# Patient Record
Sex: Female | Born: 1935
Health system: Southern US, Community
[De-identification: ages and names within clinical notes are randomized; demographics above are authoritative.]

## PROBLEM LIST (undated history)

## (undated) DIAGNOSIS — N2581 Secondary hyperparathyroidism of renal origin: Secondary | ICD-10-CM

## (undated) DIAGNOSIS — I441 Atrioventricular block, second degree: Secondary | ICD-10-CM

## (undated) DIAGNOSIS — R3915 Urgency of urination: Secondary | ICD-10-CM

## (undated) DIAGNOSIS — E039 Hypothyroidism, unspecified: Secondary | ICD-10-CM

## (undated) DIAGNOSIS — Z950 Presence of cardiac pacemaker: Secondary | ICD-10-CM

## (undated) DIAGNOSIS — W19XXXA Unspecified fall, initial encounter: Secondary | ICD-10-CM

## (undated) HISTORY — PX: CATARACT EXTRACTION: SUR2

## (undated) HISTORY — DX: Urgency of urination: R39.15

## (undated) HISTORY — DX: Hypothyroidism, unspecified: E03.9

## (undated) HISTORY — DX: Secondary hyperparathyroidism of renal origin: N25.81

## (undated) HISTORY — DX: Atrioventricular block, second degree: I44.1

## (undated) HISTORY — DX: Unspecified fall, initial encounter: W19.XXXA

## (undated) HISTORY — PX: APPENDECTOMY: SHX54

---

## 1995-04-12 HISTORY — PX: OTHER SURGICAL HISTORY: SHX169

## 2004-03-30 ENCOUNTER — Ambulatory Visit: Payer: Self-pay | Admitting: Internal Medicine

## 2005-03-11 ENCOUNTER — Ambulatory Visit: Payer: Self-pay | Admitting: Internal Medicine

## 2006-03-28 ENCOUNTER — Ambulatory Visit: Payer: Self-pay | Admitting: Internal Medicine

## 2006-03-28 LAB — CONVERTED CEMR LAB
Basophils Relative: 0.5 % (ref 0.0–1.0)
CO2: 27 meq/L (ref 19–32)
Calcium: 9.2 mg/dL (ref 8.4–10.5)
Chloride: 102 meq/L (ref 96–112)
Eosinophil percent: 2.4 % (ref 0.0–5.0)
Glucose, Bld: 89 mg/dL (ref 70–99)
Hemoglobin: 13.7 g/dL (ref 12.0–15.0)
Monocytes Absolute: 0.6 10*3/uL (ref 0.2–0.7)
Monocytes Relative: 7.5 % (ref 3.0–11.0)
Platelets: 239 10*3/uL (ref 150–400)
RDW: 12.4 % (ref 11.5–14.6)
Sodium: 138 meq/L (ref 135–145)
WBC: 7.9 10*3/uL (ref 4.5–10.5)

## 2006-07-05 ENCOUNTER — Ambulatory Visit: Payer: Self-pay | Admitting: Internal Medicine

## 2006-07-20 ENCOUNTER — Ambulatory Visit: Payer: Self-pay | Admitting: Internal Medicine

## 2006-07-21 ENCOUNTER — Encounter: Payer: Self-pay | Admitting: Internal Medicine

## 2006-10-12 ENCOUNTER — Ambulatory Visit: Payer: Self-pay | Admitting: Internal Medicine

## 2006-10-12 LAB — CONVERTED CEMR LAB: Vit D, 1,25-Dihydroxy: 55 (ref 20–57)

## 2006-10-17 ENCOUNTER — Ambulatory Visit: Payer: Self-pay | Admitting: Internal Medicine

## 2006-10-20 DIAGNOSIS — E039 Hypothyroidism, unspecified: Secondary | ICD-10-CM | POA: Insufficient documentation

## 2006-10-20 DIAGNOSIS — M810 Age-related osteoporosis without current pathological fracture: Secondary | ICD-10-CM | POA: Insufficient documentation

## 2007-01-08 ENCOUNTER — Telehealth: Payer: Self-pay | Admitting: Internal Medicine

## 2007-03-05 ENCOUNTER — Ambulatory Visit: Payer: Self-pay | Admitting: Internal Medicine

## 2007-04-17 ENCOUNTER — Ambulatory Visit: Payer: Self-pay | Admitting: Internal Medicine

## 2007-04-17 LAB — CONVERTED CEMR LAB: TSH: 3.45 microintl units/mL (ref 0.35–5.50)

## 2007-04-24 ENCOUNTER — Ambulatory Visit: Payer: Self-pay | Admitting: Internal Medicine

## 2007-04-24 DIAGNOSIS — R3915 Urgency of urination: Secondary | ICD-10-CM

## 2007-04-24 DIAGNOSIS — M159 Polyosteoarthritis, unspecified: Secondary | ICD-10-CM | POA: Insufficient documentation

## 2007-06-13 ENCOUNTER — Ambulatory Visit: Payer: Self-pay | Admitting: Internal Medicine

## 2007-06-13 DIAGNOSIS — H9390 Unspecified disorder of ear, unspecified ear: Secondary | ICD-10-CM | POA: Insufficient documentation

## 2007-06-18 LAB — CONVERTED CEMR LAB: Vit D, 1,25-Dihydroxy: 19 — ABNORMAL LOW (ref 30–89)

## 2007-09-20 ENCOUNTER — Ambulatory Visit: Payer: Self-pay | Admitting: Internal Medicine

## 2007-10-03 LAB — CONVERTED CEMR LAB: Vit D, 1,25-Dihydroxy: 39 (ref 30–89)

## 2007-11-14 ENCOUNTER — Ambulatory Visit: Payer: Self-pay | Admitting: Internal Medicine

## 2007-11-14 DIAGNOSIS — R42 Dizziness and giddiness: Secondary | ICD-10-CM

## 2007-11-14 DIAGNOSIS — R209 Unspecified disturbances of skin sensation: Secondary | ICD-10-CM | POA: Insufficient documentation

## 2007-11-20 ENCOUNTER — Telehealth: Payer: Self-pay | Admitting: Internal Medicine

## 2007-12-19 ENCOUNTER — Encounter: Payer: Self-pay | Admitting: Internal Medicine

## 2008-04-10 ENCOUNTER — Telehealth: Payer: Self-pay | Admitting: *Deleted

## 2008-05-07 ENCOUNTER — Telehealth: Payer: Self-pay | Admitting: Internal Medicine

## 2008-06-13 ENCOUNTER — Ambulatory Visit: Payer: Self-pay | Admitting: Internal Medicine

## 2008-08-26 ENCOUNTER — Telehealth: Payer: Self-pay | Admitting: Internal Medicine

## 2008-09-22 ENCOUNTER — Telehealth: Payer: Self-pay | Admitting: Family Medicine

## 2008-10-27 ENCOUNTER — Ambulatory Visit: Payer: Self-pay | Admitting: Internal Medicine

## 2008-10-30 ENCOUNTER — Ambulatory Visit: Payer: Self-pay | Admitting: Internal Medicine

## 2008-10-30 DIAGNOSIS — R634 Abnormal weight loss: Secondary | ICD-10-CM | POA: Insufficient documentation

## 2008-10-30 DIAGNOSIS — R404 Transient alteration of awareness: Secondary | ICD-10-CM

## 2008-11-05 DIAGNOSIS — E538 Deficiency of other specified B group vitamins: Secondary | ICD-10-CM | POA: Insufficient documentation

## 2008-12-04 ENCOUNTER — Telehealth: Payer: Self-pay | Admitting: *Deleted

## 2008-12-08 ENCOUNTER — Ambulatory Visit: Payer: Self-pay | Admitting: Internal Medicine

## 2008-12-16 ENCOUNTER — Ambulatory Visit: Payer: Self-pay | Admitting: Internal Medicine

## 2009-01-01 ENCOUNTER — Telehealth: Payer: Self-pay | Admitting: *Deleted

## 2009-01-27 ENCOUNTER — Ambulatory Visit: Payer: Self-pay | Admitting: Endocrinology

## 2009-01-27 DIAGNOSIS — E559 Vitamin D deficiency, unspecified: Secondary | ICD-10-CM | POA: Insufficient documentation

## 2009-01-27 DIAGNOSIS — N2581 Secondary hyperparathyroidism of renal origin: Secondary | ICD-10-CM | POA: Insufficient documentation

## 2009-04-13 ENCOUNTER — Ambulatory Visit: Payer: Self-pay | Admitting: Endocrinology

## 2009-04-23 ENCOUNTER — Telehealth: Payer: Self-pay | Admitting: Endocrinology

## 2009-07-21 ENCOUNTER — Telehealth: Payer: Self-pay | Admitting: Endocrinology

## 2009-12-15 ENCOUNTER — Telehealth: Payer: Self-pay | Admitting: Endocrinology

## 2010-01-12 ENCOUNTER — Encounter: Payer: Self-pay | Admitting: Internal Medicine

## 2010-01-29 ENCOUNTER — Telehealth: Payer: Self-pay | Admitting: Endocrinology

## 2010-02-03 ENCOUNTER — Ambulatory Visit: Payer: Self-pay | Admitting: Endocrinology

## 2010-02-04 LAB — CONVERTED CEMR LAB: TSH: 0.69 microintl units/mL (ref 0.35–5.50)

## 2010-03-02 ENCOUNTER — Encounter: Payer: Self-pay | Admitting: Internal Medicine

## 2010-03-02 ENCOUNTER — Ambulatory Visit: Payer: Self-pay | Admitting: Internal Medicine

## 2010-05-09 LAB — CONVERTED CEMR LAB
Albumin: 3.5 g/dL (ref 3.5–5.2)
Basophils Absolute: 0 10*3/uL (ref 0.0–0.1)
Basophils Relative: 0.2 % (ref 0.0–3.0)
Calcium, Total (PTH): 8.8 mg/dL (ref 8.4–10.5)
Cholesterol: 177 mg/dL (ref 0–200)
Folate: >20 ng/mL
GFR calc non Af Amer: 71.53 mL/min (ref >60)
Glucose, Bld: 99 mg/dL (ref 70–99)
HDL: 58.3 mg/dL (ref >39.00)
Hemoglobin: 13.1 g/dL (ref 12.0–15.0)
Lymphocytes Relative: 29.7 % (ref 12.0–46.0)
Monocytes Relative: 6 % (ref 3.0–12.0)
Neutro Abs: 4.6 10*3/uL (ref 1.4–7.7)
PTH: 75.9 pg/mL — ABNORMAL HIGH (ref 14.0–72.0)
PTH: 83.5 pg/mL — ABNORMAL HIGH (ref 14.0–72.0)
Potassium: 3.7 meq/L (ref 3.5–5.1)
RBC: 4.07 M/uL (ref 3.87–5.11)
RDW: 12.5 % (ref 11.5–14.6)
Sodium: 142 meq/L (ref 135–145)
Total Protein: 6.3 g/dL (ref 6.0–8.3)
Triglycerides: 73 mg/dL (ref 0.0–149.0)
Vit D, 25-Hydroxy: 23 ng/mL — ABNORMAL LOW (ref 30–89)
Vitamin B-12: 114 pg/mL — ABNORMAL LOW (ref 211–911)
Vitamin B-12: 304 pg/mL (ref 211–911)
WBC: 7.2 10*3/uL (ref 4.5–10.5)

## 2010-05-11 NOTE — Assessment & Plan Note (Signed)
Summary: 2 MTH FU  STC  RS'D PER PT/NWS   Vital Signs:  Patient profile:   77 year old female Menstrual status:  postmenopausal Height:      66 inches (167.64 cm) Weight:      117.13 pounds (53.24 kg) O2 Sat:      98 % on Room air Temp:     97.3 degrees F (36.28 degrees C) oral Pulse rate:   78 / minute BP sitting:   116 / 64  (left arm) Cuff size:   regular  Vitals Entered By: Josph Macho CMA (April 13, 2009 10:06 AM)  O2 Flow:  Room air CC: 2 month follow up/ Cf Is Patient Diabetic? No   Referring Provider:  Dr. Fabian Sharp Primary Provider:  Dr. Fabian Sharp  CC:  2 month follow up/ Cf.  History of Present Illness: pt brings her rocaltrol as rx'ed.  she brings her med bottles today.  she feels no different on the rocaltrol, and well in general.  she denies falls.  Current Medications (verified): 1)  Levoxyl 75 Mcg  Tabs (Levothyroxine Sodium) .Marland Kitchen.. 1 By Mouth Once Daily 2)  Vesicare 10 Mg  Tabs (Solifenacin Succinate) .... Take  1/2 By Mouth Once Daily For Bladder 3)  Vitamin B-12 1000 Mcg Tabs (Cyanocobalamin) .Marland Kitchen.. 1 Daily 4)  Calcium 500 Mg Tabs (Calcium Carbonate) .Marland Kitchen.. 1 Daily 5)  Calcitriol 0.25 Mcg Caps (Calcitriol) .Marland Kitchen.. 1 Qd  Allergies (verified): No Known Drug Allergies  Past History:  Past Medical History: Hypothyroidism Osteoporosis Urinary urgency ?incontinence  Hearing deficit    Consults None  Review of Systems       denies cramps.  Physical Exam  General:  elderly, frail, no distress  Chest Wall:  no kyphosis Msk:  muscle bulk and strength are grossly normal for age.  no obvious joint swelling.  gait is normal and steady  Psych:  Alert and cooperative; normal mood and affect; normal attention span and concentration.   Additional Exam:  Parathyroid Hormone       59.9 pg/mL                  14.0-72.0 Calcium                   9.3 mg/dL    Impression & Recommendations:  Problem # 1:  SECONDARY HYPERPARATHYROIDISM  (ICD-588.81) well-controlled  Other Orders: T-Parathyroid Hormone, Intact w/ Calcium ([address]) Est. Patient Level III ([address])  Patient Instructions: 1)  pending today's test results,  2)  continue calcitriol 0.25 micrograms/day 3)  return 6 months 4)  tests are being ordered for you today.  a few days after the test(s), please call [phone] to hear your test results.  Preventive Care Screening  Last Flu Shot:    Date:  02/09/2009    Results:  historical

## 2010-05-11 NOTE — Progress Notes (Signed)
Summary: [facility]/Dr. Everardo All  Phone Note Call from Patient Call back at Home Phone [phone]   Caller: Patient Summary of Call: pt called stating that Vesicare is too expensive. pt is requesting alt medication or would like to know if she can stop taking all together? please advise Initial call taken by: Margaret Pyle, CMA,  April 23, 2009 9:40 AM  Follow-up for Phone Call        I would not be in postion to say yes or no on the medication; I can change to oxybutinin generic if she wants. - would she want this? Follow-up by: Corwin Levins MD,  April 23, 2009 12:50 PM  Additional Follow-up for Phone Call Additional follow up Details #1::        left message on machine for pt to return my call  Additional Follow-up by: Margaret Pyle, CMA,  April 23, 2009 3:40 PM    Additional Follow-up for Phone Call Additional follow up Details #2::    Spoke with pt and wants to wait until Dr Everardo All gets back to make a change. Follow-up by: Josph Macho CMA,  April 24, 2009 10:23 AM   Appended Document: [facility] med/Dr. Everardo All change to oxybutinin 2.5 mg two times a day i sent rx Vanna Sailer, md  pt ststed that "her kidney are fine" and she does not want the medication. I advised that it was sent to the pharmacy and she said she"ll get it if she needs it. Margaret Pyle, CMA  May 04, 2009 1:25 PM   Clinical Lists Changes  Medications: Removed medication of VESICARE 10 MG  TABS (SOLIFENACIN SUCCINATE) take  1/2 by mouth once daily for bladder - Signed Added new medication of OXYBUTYNIN CHLORIDE 5 MG TABS (OXYBUTYNIN CHLORIDE) 1/2 tab bid - Signed Rx of OXYBUTYNIN CHLORIDE 5 MG TABS (OXYBUTYNIN CHLORIDE) 1/2 tab bid;  #30 x 11;  Signed;  Entered by: Minus Breeding MD;  Authorized by: Minus Breeding MD;  Method used: Electronically to [facility] [address] [place], [place], [place]  [address], Ph: [phone], Fax:  [phone]  noted, thank you Deshondra Worst, md  Prescriptions: OXYBUTYNIN CHLORIDE 5 MG TABS (OXYBUTYNIN CHLORIDE) 1/2 tab bid  #30 x 11   Entered and Authorized by:   Minus Breeding MD   Signed by:   Minus Breeding MD on 05/04/2009   Method used:   Electronically to        [facility] [address] (retail)       [address]       [place], [place]  [address]       Ph: [phone]       Fax: [phone]   RxID:   [id number]

## 2010-05-11 NOTE — Progress Notes (Signed)
Summary: Rx refill req  Phone Note Refill Request Message from:  Pharmacy on December 15, 2009 1:13 PM  Refills Requested: Medication #1:  LEVOXYL 75 MCG  TABS 1 by mouth once daily   Dosage confirmed as above?Dosage Confirmed   Supply Requested: 3 months  Method Requested: Electronic Initial call taken by: Margaret Pyle, CMA,  December 15, 2009 1:13 PM  Follow-up for Phone Call        Pt and pharmacy advised that pt needs OV, no further refills Follow-up by: Margaret Pyle, CMA,  December 15, 2009 1:14 PM    Prescriptions: LEVOXYL 75 MCG  TABS (LEVOTHYROXINE SODIUM) 1 by mouth once daily  #30 x 1   Entered and Authorized by:   Margaret Pyle, CMA   Signed by:   Margaret Pyle, CMA on 12/15/2009   Method used:   Telephoned to ...       [facility] [address] (retail)       [address]       [place], [place]  [address]       Ph: [phone]       Fax: [phone]   RxID:   [id number]

## 2010-05-11 NOTE — Progress Notes (Signed)
Summary: Calcitrol ?  Phone Note From Pharmacy   Caller: [facility] [address] Summary of Call: Pharmacy called. Pt has not been on calcitrol for several months. She is confused as to whether or not she should be on the medication. Please advise.  Initial call taken by: Lamar Sprinkles, CMA,  January 29, 2010 2:43 PM  Follow-up for Phone Call        as of last ov, answer is yes, you should be on it, but you are due for f/u Follow-up by: Minus Breeding MD,  January 29, 2010 2:46 PM  Additional Follow-up for Phone Call Additional follow up Details #1::        Pharm informed Additional Follow-up by: Lamar Sprinkles, CMA,  January 29, 2010 4:42 PM

## 2010-05-11 NOTE — Assessment & Plan Note (Signed)
Summary: per flag f/u #//cd   Vital Signs:  Patient profile:   77 year old female Menstrual status:  postmenopausal Height:      66 inches (167.64 cm) Weight:      114.50 pounds (52.05 kg) BMI:     18.55 O2 Sat:      97 % on Room air Temp:     98.1 degrees F (36.72 degrees C) oral Pulse rate:   86 / minute BP sitting:   110 / 70  (left arm) Cuff size:   regular  Vitals Entered By: Brenton Grills MA (February 03, 2010 9:53 AM)  O2 Flow:  Room air CC: Follow-up visit/aj Is Patient Diabetic? No   Referring Provider:  Dr. Fabian Sharp Primary Provider:  Dr. Fabian Sharp  CC:  Follow-up visit/aj.  History of Present Illness: pt has h/o secondary hyperparathyroidism, of uncertain etiology.  she is uncertain how long ago she ran out of rocaltrol.  no recent falls or bony fx.  Current Medications (verified): 1)  Levoxyl 75 Mcg  Tabs (Levothyroxine Sodium) .Marland Kitchen.. 1 By Mouth Once Daily 2)  Vitamin B-12 1000 Mcg Tabs (Cyanocobalamin) .Marland Kitchen.. 1 Daily 3)  Calcium 500 Mg Tabs (Calcium Carbonate) .Marland Kitchen.. 1 Daily 4)  Calcitriol 0.25 Mcg Caps (Calcitriol) .Marland Kitchen.. 1 Qd 5)  Oxybutynin Chloride 5 Mg Tabs (Oxybutynin Chloride) .... 1/2 Tab Bid  Allergies (verified): No Known Drug Allergies  Past History:  Past Medical History: Last updated: 04/13/2009 Hypothyroidism Osteoporosis Urinary urgency ?incontinence  Hearing deficit    Consults None  Review of Systems  The patient denies syncope.    Physical Exam  General:  elderly, frail, no distress  Neck:  Supple without thyroid enlargement or tenderness.  Msk:  gait is normal and steady no deformity Additional Exam:  Parathyroid Hormone       65.6 pg/mL                  14.0-72.0 Calcium                   9.2 mg/dL   FastTSH                   0.69 uIU/mL      Impression & Recommendations:  Problem # 1:  SECONDARY HYPERPARATHYROIDISM (ICD-588.81) well-controlled  Problem # 2:  HYPOTHYROIDISM (ICD-244.9) well-replaced  Other  Orders: T-Parathyroid Hormone, Intact w/ Calcium ([address]) TLB-TSH (Thyroid Stimulating Hormone) (84443-TSH) Est. Patient Level III ([address])  Patient Instructions: 1)  blood tests are being ordered for you today.  please call [phone] to hear your test results. 2)  pending today's test results, you should resume calcitriol 0.25 micrograms/day.  i have sent a refill to your pharmacy 3)  you are also due for a checkup with dr Fabian Sharp.  please make an appointment. 4)  Please schedule a follow-up appointment in 1 year. 5)  (update: i left message on phone-tree:  rx as we discussed) Prescriptions: CALCITRIOL 0.25 MCG CAPS (CALCITRIOL) 1 qd  #30 x 11   Entered and Authorized by:   Minus Breeding MD   Signed by:   Minus Breeding MD on 02/03/2010   Method used:   Electronically to        [facility] [address] (retail)       [address]       [place], [place]  [address]       Ph: [phone]  Fax: [phone]   RxID:   [id number]    Orders Added: 1)  T-Parathyroid Hormone, Intact w/ Calcium [[address]] 2)  TLB-TSH (Thyroid Stimulating Hormone) [84443-TSH] 3)  Est. Patient Level III [[address]]

## 2010-05-11 NOTE — Progress Notes (Signed)
Summary: weight loss  Phone Note Call from Patient Call back at Home Phone [phone]   Caller: Patient Summary of Call: pt called stating that she has lost 10lb and has been trying to gain it back with no success. Pt is requesting MD's advise as to what she can use to help, please advise. Initial call taken by: Margaret Pyle, CMA,  July 21, 2009 10:53 AM  Follow-up for Phone Call        please ask dr Fabian Sharp about this, as i only take care of the calcium mpart. Follow-up by: Minus Breeding MD,  July 21, 2009 1:02 PM  Additional Follow-up for Phone Call Additional follow up Details #1::        pt informed via VM Additional Follow-up by: Margaret Pyle, CMA,  July 21, 2009 2:02 PM

## 2010-05-11 NOTE — Progress Notes (Signed)
Summary: calcitriol  Phone Note Refill Request Message from:  Fax from Pharmacy on January 29, 2010 9:14 AM  Refills Requested: Medication #1:  CALCITRIOL 0.25 MCG CAPS 1 qd   Dosage confirmed as above?Dosage Confirmed   Last Refilled: 06/05/2009  Method Requested: Electronic Initial call taken by: Brenton Grills MA,  January 29, 2010 9:15 AM    Prescriptions: CALCITRIOL 0.25 MCG CAPS (CALCITRIOL) 1 qd  #30 x 0   Entered by:   Brenton Grills MA   Authorized by:   Minus Breeding MD   Signed by:   Brenton Grills MA on 01/29/2010   Method used:   Electronically to        [facility] [address] (retail)       [address]       [place], [place]  [address]       Ph: [phone]       Fax: [phone]   RxID:   [id number]

## 2010-05-11 NOTE — Miscellaneous (Signed)
Summary: Flu Shot/[facility]  Flu Shot/[facility]   Imported By: Maryln Gottron 01/18/2010 11:01:44  _____________________________________________________________________  External Attachment:    Type:   Image     Comment:   External Document

## 2010-05-11 NOTE — Assessment & Plan Note (Signed)
Summary: FU PER PT/NJR   Vital Signs:  Patient profile:   77 year old female Menstrual status:  postmenopausal Height:      65 inches Weight:      116 pounds Pulse rate:   66 / minute BP sitting:   110 / 70  (left arm) Cuff size:   regular  Vitals Entered By: Romualdo Bolk, CMA (AAMA) (March 02, 2010 10:18 AM) CC: Annual Visit for Disease Management- Pt wants to discuss gaining weight.- Pt is not fasting for labs.   History of Present Illness: Vanessa Keller    Here for Medicare AWV:  She has concerns about losing weight but feels pretty well. She is followed by Dr Meryl Crutch or her thyroid disease.   was told to follow up with PCP also. She is still hard of heaing and forgot her hearing aid today.  No faling or change in vision  1.   Risk factors based on Past M, S, F history: 2.   Physical Activities: independent and no falling .  still fardens some  plans to move to apartment condo type community 3.   Depression/mood: Good 4.   Hearing:  decrease 5.   ADL's:  all  plays bridge still  6.   Fall Risk:  no falling or dizziness 7.   Home Safety:  reviewed 8.   Height, weight, &visual acuity: reviewed   eye appt  9.   Counseling:  10.   Labs ordered based on risk factors:  11.           Referral Coordination 12.           Care Plan 13.            Cognitive Assessment  Pt is A&Ox3,affect,speech,memory,attention,&motor skills appear intact.   although says memory off some.  no disoeientation.    eats 3 x per day   /?losing weight.   ? gingko biloba. rec by friend and shows bottle today  moving   [place] .    Preventive Care Screening  Mammogram:    Date:  04/11/2008    Results:  normal   Last Tetanus Booster:    Date:  04/11/2004    Results:  Historical    Preventive Screening-Counseling & Management  Alcohol-Tobacco     Alcohol drinks/day: 0     Smoking Status: never  Caffeine-Diet-Exercise     Caffeine use/day: 1     Does Patient Exercise: yes  Type of exercise: Yard work     [facility] Depression Score: no  Hep-HIV-STD-Contraception     Dental Visit-last 6 months yes     Sun Exposure-Excessive: no  Safety-Violence-Falls     Seat Belt Use: yes     Smoke Detectors: yes     Fall Risk: no falls good balance works in [employer]  Current Medications (verified): 1)  Levoxyl 75 Mcg  Tabs (Levothyroxine Sodium) .Marland Kitchen.. 1 By Mouth Once Daily 2)  Vitamin B-12 1000 Mcg Tabs (Cyanocobalamin) .Marland Kitchen.. 1 Daily 3)  Calcium 500 Mg Tabs (Calcium Carbonate) .Marland Kitchen.. 1 Daily 4)  Calcitriol 0.25 Mcg Caps (Calcitriol) .Marland Kitchen.. 1 Qd 5)  Oxybutynin Chloride 5 Mg Tabs (Oxybutynin Chloride) .... 1/2 Tab Bid  Allergies (verified): No Known Drug Allergies  Past History:  Past medical, surgical, family and social histories (including risk factors) reviewed, and no changes noted (except as noted below).  Past Medical History: Reviewed history from 04/13/2009 and no changes required. Hypothyroidism Osteoporosis Urinary urgency ?incontinence  Hearing deficit  Consults None  Past Surgical History: Reviewed history from 10/20/2006 and no changes required. Left Carotid Tumor sx-nonmalignant 1997  Past History:  Care Management: Endocrinology: Everardo All  Family History: Reviewed history from 11/14/2007 and no changes required. Family History Other cancer Family History Thyroid disease  mom lived to 100 no cad   Social History: Reviewed history from 10/30/2008 and no changes required. widowed child died age 58 yrs Never Smoked   plays bridge without problem   3 meals a day prepared for her.   moving to  [place] soon   . Fall Risk:  no falls good balance works in [employer] w/in 6 mos.:  yes Sun Exposure-Excessive:  no Risk analyst Use:  yes  Review of Systems  The patient denies anorexia, fever, weight gain, vision loss, chest pain, syncope, dyspnea on exertion, peripheral edema, prolonged cough, abdominal pain, melena, hematochezia, severe  indigestion/heartburn, hematuria, muscle weakness, transient blindness, difficulty walking, depression, unusual weight change, abnormal bleeding, enlarged lymph nodes, angioedema, and breast masses.    Physical Exam  General:  elderly slender but not frail in nad  but hard of hearing.  gait nl interaction normal and has snse of humor Head:  Normocephalic and atraumatic without obvious abnormalities. No apparent alopecia or balding. Eyes:  vision grossly intact, pupils equal, and pupils round.   Ears:  hard of hearing no change  Nose:  no external deformity, no external erythema, and no nasal discharge.   Mouth:  pharynx pink and moist.   Neck:  No deformities, masses, or tenderness noted. Chest Wall:  No deformities, masses, or tenderness noted. Breasts:  No mass, nodules, thickening, tenderness, bulging, retraction, inflamation, nipple discharge or skin changes noted.   Lungs:  Normal respiratory effort, chest expands symmetrically. Lungs are clear to auscultation, no crackles or wheezes. Heart:  Normal rate and regular rhythm. S1 and S2 normal without gallop, murmur, click, rub or other extra sounds.no lifts.   Abdomen:  Bowel sounds positive,abdomen soft and non-tender without masses, organomegaly or hernias noted. Msk:  no joint warmth and no redness over joints.  some oa changes Pulses:  pulses intact without delay   Extremities:  no clubbing cyanosis or edema  Neurologic:  alert & oriented X3, strength normal in all extremities, and gait normal.  no tremor  Pt is A&Ox3,affect,speech,memory,attention,&motor skills appear intact.  Skin:  turgor normal, no suspicious lesions, no ecchymoses, and no petechiae.   Cervical Nodes:  No lymphadenopathy noted Axillary Nodes:  No palpable lymphadenopathy Inguinal Nodes:  No significant adenopathy Psych:  Oriented X3, normally interactive, good eye contact, not anxious appearing, and not depressed appearing.   EKG sinus rhythym LAD  qrs .108 [employer] qs  in anterior leads.  No prev to compare  Impression & Recommendations:  Problem # 1:  Preventive Health Care (ICD-V70.0) disc injury prevention diet  nutritionand activity    Problem # 2:  WEIGHT LOSS (ICD-783.21)  mild but present ove r the last year . actually up 2 # since last visit  she states sh feels well otherrwise  although wonders about her memory  .  plays bridge and  no problems with this.   orented x 3  today .   Orders: EKG w/ Interpretation (93000)  Problem # 3:  HEARING DEFICIT (ICD-389.9) forgot her herin aids today and difficult to asses   Problem # 4:  URINARY URGENCY (UJW-119.14) no change   Problem # 5:  VITAMIN D DEFICIENCY (ICD-268.9) per dr Everardo All  Problem # 6:  SECONDARY HYPERPARATHYROIDISM (ICD-588.81)  Complete Medication List: 1)  Levoxyl 75 Mcg Tabs (Levothyroxine sodium) .Marland Kitchen.. 1 by mouth once daily 2)  Vitamin B-12 1000 Mcg Tabs (Cyanocobalamin) .Marland Kitchen.. 1 daily 3)  Calcium 500 Mg Tabs (Calcium carbonate) .Marland Kitchen.. 1 daily 4)  Calcitriol 0.25 Mcg Caps (Calcitriol) .Marland Kitchen.. 1 qd 5)  Oxybutynin Chloride 5 Mg Tabs (Oxybutynin chloride) .... 1/2 tab bid  Patient Instructions: 1)  Increase   calories  to gain weight. 2)  Nuts  , peanut butter , cheeses,  other . 3)  ROV in 4 months  4)  we will weigh you at that time and if still losing weight then we may dosome more blood test and chest x ray  or other evaluation as  appropriate.  5)  If you wish we can also do a dietary consult  to go over your diet and  get advice about   how to  advance  increase calories in your diet . 6)  Continue  medications  including the  b12 , calcium and calcitrol.    Orders Added: 1)  Est. Patient 65& > [99397] 2)  Est. Patient Level III [32[address]] 3)  EKG w/ Interpretation [93000]     Eye Exam  02/09/2009- unsure of name. Normal. Romualdo Bolk, CMA (AAMA)  March 02, 2010 10:24 AM

## 2010-07-16 ENCOUNTER — Other Ambulatory Visit: Payer: Self-pay | Admitting: Endocrinology

## 2010-07-20 ENCOUNTER — Telehealth: Payer: Self-pay | Admitting: Internal Medicine

## 2010-07-20 NOTE — Telephone Encounter (Signed)
Pt needing to get a fasting cpx before end of June. Pt is moving to retirement home. Pls advise.

## 2010-07-21 NOTE — Telephone Encounter (Signed)
appt made

## 2010-07-21 NOTE — Telephone Encounter (Signed)
Per Dr. Fabian Sharp- can do 5/16 at 9:15am for cpx.

## 2010-08-13 ENCOUNTER — Encounter: Payer: Self-pay | Admitting: Internal Medicine

## 2010-08-16 ENCOUNTER — Encounter: Payer: Self-pay | Admitting: Internal Medicine

## 2010-08-16 ENCOUNTER — Ambulatory Visit (INDEPENDENT_AMBULATORY_CARE_PROVIDER_SITE_OTHER): Payer: Medicare Other | Admitting: Internal Medicine

## 2010-08-16 ENCOUNTER — Telehealth: Payer: Self-pay | Admitting: *Deleted

## 2010-08-16 VITALS — BP 100/60 | HR 78 | Ht 65.0 in | Wt 117.0 lb

## 2010-08-16 DIAGNOSIS — Z23 Encounter for immunization: Secondary | ICD-10-CM

## 2010-08-16 DIAGNOSIS — W19XXXA Unspecified fall, initial encounter: Secondary | ICD-10-CM

## 2010-08-16 DIAGNOSIS — E538 Deficiency of other specified B group vitamins: Secondary | ICD-10-CM

## 2010-08-16 DIAGNOSIS — Z0000 Encounter for general adult medical examination without abnormal findings: Secondary | ICD-10-CM

## 2010-08-16 DIAGNOSIS — M810 Age-related osteoporosis without current pathological fracture: Secondary | ICD-10-CM

## 2010-08-16 DIAGNOSIS — Z9181 History of falling: Secondary | ICD-10-CM

## 2010-08-16 DIAGNOSIS — S80219A Abrasion, unspecified knee, initial encounter: Secondary | ICD-10-CM

## 2010-08-16 DIAGNOSIS — H9190 Unspecified hearing loss, unspecified ear: Secondary | ICD-10-CM

## 2010-08-16 DIAGNOSIS — Z13220 Encounter for screening for lipoid disorders: Secondary | ICD-10-CM

## 2010-08-16 DIAGNOSIS — R3915 Urgency of urination: Secondary | ICD-10-CM

## 2010-08-16 DIAGNOSIS — N2581 Secondary hyperparathyroidism of renal origin: Secondary | ICD-10-CM

## 2010-08-16 DIAGNOSIS — E039 Hypothyroidism, unspecified: Secondary | ICD-10-CM

## 2010-08-16 LAB — CBC WITH DIFFERENTIAL/PLATELET
Eosinophils Relative: 1.2 % (ref 0.0–5.0)
Monocytes Absolute: 0.4 10*3/uL (ref 0.1–1.0)
Monocytes Relative: 6.2 % (ref 3.0–12.0)
Neutrophils Relative %: 63.6 % (ref 43.0–77.0)
Platelets: 187 10*3/uL (ref 150.0–400.0)
WBC: 7.1 10*3/uL (ref 4.5–10.5)

## 2010-08-16 LAB — TSH: TSH: 0.23 u[IU]/mL — ABNORMAL LOW (ref 0.35–5.50)

## 2010-08-16 LAB — BASIC METABOLIC PANEL
BUN: 15 mg/dL (ref 6–23)
Calcium: 8.7 mg/dL (ref 8.4–10.5)
Creatinine, Ser: 0.7 mg/dL (ref 0.4–1.2)
GFR: 85.94 mL/min (ref >60.00)

## 2010-08-16 LAB — VITAMIN B12: Vitamin B-12: 189 pg/mL — ABNORMAL LOW (ref 211–911)

## 2010-08-16 LAB — LIPID PANEL
LDL Cholesterol: 108 mg/dL — ABNORMAL HIGH (ref 0–99)
Total CHOL/HDL Ratio: 3
Triglycerides: 36 mg/dL (ref 0.0–149.0)

## 2010-08-16 NOTE — Telephone Encounter (Signed)
Pt fell in the lobby going to the lab. She states that she feels fine except her knees hurt a little from the carpet. I looked at Pt's knees and they were red but not swollen. Pt states that she can walk. Md to see pt.

## 2010-08-16 NOTE — Patient Instructions (Signed)
Will notify you  of labs when available. ROV in 6 months or depending on labs .  Consider getting advance directives  Defined.

## 2010-08-16 NOTE — Progress Notes (Signed)
Subjective:    Vanessa Keller is a 77 y.o. female who presents for a Medicare  Wellness and exam.   Cardiac risk factors: advanced age (older than 19 for men, 59 for women).  Activities of Daily Living  In your present state of health, do you have any difficulty performing the following activities?:  Preparing food and eating?: No Bathing yourself: No Getting dressed: No Using the toilet:No Moving around from place to place: No In the past year have you fallen or had a near fall?:No  Current exercise habits: Home exercise routine includes "whatever she needs to be doing".   Dietary issues discussed: eating well.  Prepares most of her own food   Depression Screen (Note: if answer to either of the following is "Yes", then a more complete depression screening is indicated)  Q1: Over the past two weeks, have you felt down, depressed or hopeless?no Q2: Over the past two weeks, have you felt little interest or pleasure in doing things? no   Hearing:  Hearing aids.  Vision:  Stable   Needs check  Follow up   Safety: No falls .  Has smoke detector and wears seat belts.  No firearms. No excess sun exposure. Sees dentist regularly   Advance directive :  Reviewed  Issue about this doesn't have foramal one. Memory: Not as good bu thinks its ok.   The following portions of the patient's history were reviewed and updated as appropriate: allergies, current medications, past family history, past medical history, past social history, past surgical history and problem list. Review of Systems Pertinent items are noted in HPI.  No cp sob syncope.     Objective:     Vision done by Dr. Nile Riggs on 04/12/2007 Blood pressure 100/60, pulse 78, height 5\' 5"  (1.651 m), weight 117 lb (53.071 kg). Body mass index is 19.47 kg/(m^2). Physical Exam: Vital signs reviewed YNW:GNFA is a well-developed well-nourished alert cooperative  white female who appears her stated age in no acute distress.  Hard of  hearing cognitively intact HEENT: normocephalic  traumatic , Eyes: PERRL EOM's full, conjunctiva clear, Nares: paten,t no deformity discharge or tenderness., Ears: no deformity EAC's clear TMs with normal landmarks. Mouth: clear OP, no lesions, edema.  Moist mucous membranes. Dentition in adequate repair. NECK: supple without masses, thyromegaly or bruits. CHEST/PULM:  Clear to auscultation and percussion breath sounds equal no wheeze , rales or rhonchi. No chest wall deformities or tenderness. Breast: normal by inspection . No dimpling, discharge, masses, tenderness or discharge . LN: no cervical axillary inguinal adenopathy. CV: PMI is nondisplaced, S1 S2 no gallops, murmurs, rubs. Peripheral pulses are full without delay.No JVD .  ABDOMEN: Bowel sounds normal nontender  No guard or rebound, no hepato splenomegal no CVA tenderness.  No hernia. Extremtities:  No clubbing cyanosis or edema, no acute joint swelling or redness no focal atrophy NEURO:  Oriented x3, cranial nerves 3-12 appear to be intact, no obvious focal weakness,gait independent but  Tends to  Have foot toe out.  But  Able  to arise table easily  no abnormal reflexes or asymmetrical SKIN: No acute rashes normal turgor, color, no bruising or petechiae. PSYCH: Oriented, good eye contact, no obvious depression anxiety, cognition and judgment appear normal. Hard of hearing.       Assessment:  Medicare wellness THyroid  Under rx  Hearing deficit Calcium hyerparathyroid secondary  Followed by Dr Everardo All Unclear about calcium supp and vit d and reviewed at length today  UI  Unclear what med taking   ? 1/2  Of ditropan  Denies dizziness  Bone health   Unclear  What and if she has been on meds for this and ? Dexa. EHR does not have all data in it  At this time.  Will have to review about this.     Plan:     During the course of the visit the patient was educated and counseled about appropriate screening and preventive services  including:   Advance directives  Fall prevention, immuniz utd.   Patient Instructions (the written plan) was given to the patient.  Labs today for disease monitoring and risk   When going to lab  After leaving the exam room she fell in Lobby tripped forward per witness and fell on her knees.  No loc and "unsure what she tripped on".  Not associated with cp sob vision change Can walk and no hip pain or back pain. Dont think x ray needed at this time but if has continued pain or lom call us.    Exam  Knees  Small redness area on knee cap area bilaterally No effusion Nl rom no bruising. Good ROM and no crepitus.   Cognition no change .  VS stable.  Disc  Falling and tripping prevention and will do referral for PT falling  Would like this close to where she lives.

## 2010-08-25 ENCOUNTER — Encounter: Payer: Self-pay | Admitting: Internal Medicine

## 2010-08-29 ENCOUNTER — Encounter: Payer: Self-pay | Admitting: Internal Medicine

## 2010-08-29 DIAGNOSIS — W19XXXA Unspecified fall, initial encounter: Secondary | ICD-10-CM

## 2010-08-29 DIAGNOSIS — S80219A Abrasion, unspecified knee, initial encounter: Secondary | ICD-10-CM | POA: Insufficient documentation

## 2010-08-29 DIAGNOSIS — H9190 Unspecified hearing loss, unspecified ear: Secondary | ICD-10-CM | POA: Insufficient documentation

## 2010-08-29 HISTORY — DX: Unspecified fall, initial encounter: W19.XXXA

## 2010-09-07 ENCOUNTER — Telehealth: Payer: Self-pay | Admitting: *Deleted

## 2010-09-07 NOTE — Telephone Encounter (Signed)
See if patient can come in tomorrow at 1045 because there was a cancellation.  Probably recommend this instead of waiting until Thursday.  I also want to discuss her laboratory tests with her as she is B12 deficient still.

## 2010-09-07 NOTE — Telephone Encounter (Signed)
Pt calling for appt to see Dr Fabian Sharp today or tomorrow for left leg and foot swelling, No availability would like to be worked in, declined to see another physician.

## 2010-09-07 NOTE — Telephone Encounter (Signed)
Appt made tomorrow for swellng of leg.

## 2010-09-08 ENCOUNTER — Telehealth: Payer: Self-pay | Admitting: *Deleted

## 2010-09-08 ENCOUNTER — Ambulatory Visit (HOSPITAL_COMMUNITY)
Admission: RE | Admit: 2010-09-08 | Discharge: 2010-09-08 | Disposition: A | Discharge status: Home / Self Care | Payer: Medicare Other | Source: Ambulatory Visit | Attending: Internal Medicine | Admitting: Internal Medicine

## 2010-09-08 ENCOUNTER — Ambulatory Visit (INDEPENDENT_AMBULATORY_CARE_PROVIDER_SITE_OTHER): Payer: Medicare Other | Admitting: Internal Medicine

## 2010-09-08 ENCOUNTER — Encounter: Payer: Self-pay | Admitting: Internal Medicine

## 2010-09-08 VITALS — BP 120/80 | HR 72 | Wt 116.0 lb

## 2010-09-08 DIAGNOSIS — M79606 Pain in leg, unspecified: Secondary | ICD-10-CM

## 2010-09-08 DIAGNOSIS — E039 Hypothyroidism, unspecified: Secondary | ICD-10-CM

## 2010-09-08 DIAGNOSIS — M7989 Other specified soft tissue disorders: Secondary | ICD-10-CM

## 2010-09-08 DIAGNOSIS — M79609 Pain in unspecified limb: Secondary | ICD-10-CM | POA: Insufficient documentation

## 2010-09-08 DIAGNOSIS — H9190 Unspecified hearing loss, unspecified ear: Secondary | ICD-10-CM

## 2010-09-08 DIAGNOSIS — E559 Vitamin D deficiency, unspecified: Secondary | ICD-10-CM

## 2010-09-08 DIAGNOSIS — E538 Deficiency of other specified B group vitamins: Secondary | ICD-10-CM

## 2010-09-08 MED ORDER — CYANOCOBALAMIN 1000 MCG/ML IJ SOLN
1000.0000 ug | Freq: Once | INTRAMUSCULAR | Status: AC
  Administered 2010-09-08: 1000 ug via INTRAMUSCULAR

## 2010-09-08 NOTE — Patient Instructions (Signed)
Begin on  Vitamin b 12 shots every week for 4 x then every other week x 8 then check b12 level.  And ov. Will have dr Everardo All look at your thyroid level and make changes if needed.  Need to get X ray of lower leg and ultrasound doppler to make sure you do not have a blood clot in the leg .  Follow up depending on results of these tests   I t  is possible that you sprained your ankle when you fell a few weeks abo.

## 2010-09-08 NOTE — Telephone Encounter (Signed)
Pt came in today

## 2010-09-08 NOTE — Progress Notes (Signed)
  Subjective:    Patient ID: Vanessa Keller, female    DOB: 1915/10/10, 77 y.o.   MRN: [id number]  HPI Patient comes in today for an acute visit because she states that her left lower extremity is not right. She describes discomfort and swelling. She cannot relate any trauma except when she fell in the office here a few weeks ago. She states that she can still walk on her leg. She states there is some redness and warmth. No treatment. No history of same problem.  She did drive herself today. No history of blood clots. Review of Systems Negative chest pain shortness of breath coughing recent falling except as above numbness tingling coldness sweats or fevers. She is hard of hearing as usual. Past history family history social history reviewed in the electronic medical record.       Objective:   Physical Exam Well-developed slender but well-nourished in no acute distress well-groomed hard of hearing. Her gait seems minimally antalgic. Vital signs stable heart regular rhythm. Left lower extremity shows +2 edema in the distal leg and around the ankle joint. Some on the top of the foot. No point tenderness although somewhat tender on the medial ankle. Some discomfort in the lower calf but  no cord or warmth. Right leg has no edema.  While she was here we reviewed the laboratory studies done a few weeks ago. Related that she is low again and B12 and should be on B12 shots and perhaps after that try again with oral medications. She states that she is taking B12 pills anyway but does not bring the bottle in for this because it is not prescription.     Assessment & Plan:  Swelling discomfort of left lower extremity. Rule out DVT  bony abnormality. It is possible this is soft tissue injury from a previous fall. She does not give a very good history possibly has some memory issues.  B12 deficiency  shot to be given today and arrange for followup. See plan.  Thyroid  Slightly over suppressed  Send copy  to dr Everardo All  Also unaddressed issues unsure when she ever had a DEXA scan consider doing this in the future.  Prolonged visit arranging care because of issues with transportation to get these studies done and scheduling and coordination. Total visit > 50% spent counseling and coordinating care .

## 2010-09-08 NOTE — Telephone Encounter (Signed)
Left message for pt to call back about coming in at 11am.

## 2010-09-08 NOTE — Telephone Encounter (Signed)
Pt aware of this. 

## 2010-09-08 NOTE — Telephone Encounter (Signed)
Message copied by Romualdo Bolk on Wed Sep 08, 2010  5:26 PM ------      Message from: [facility], [place] [facility]      Created: Wed Sep 08, 2010  5:17 PM       Tell patient that her x-ray shows no fracture or acute changes except for the swelling around the ankle.            I am thinking that possibly she could just had some soft tissue injury from her fall. She doesn't have a clot in her leg. At this point we will just do close observation. Elevate her leg when possible .Marland Kitchen Avoid falling   .              Please arrange for her B12 shots weekly x4   and every other week x8      B12 level   TSH   In 2-3 months and then OV(  dont think this was done on the way out  Today )             If leg is not improving in 2 weeks call  For advice

## 2010-09-09 ENCOUNTER — Ambulatory Visit: Payer: Medicare Other | Admitting: Internal Medicine

## 2010-09-13 ENCOUNTER — Telehealth: Payer: Self-pay | Admitting: Internal Medicine

## 2010-09-13 NOTE — Telephone Encounter (Signed)
Please help with this

## 2010-09-13 NOTE — Telephone Encounter (Signed)
Pt called and wanted to know when she needs to start coming in for b-12 injs. Pls advise.

## 2010-09-15 ENCOUNTER — Encounter: Payer: Self-pay | Admitting: Internal Medicine

## 2010-09-17 ENCOUNTER — Ambulatory Visit (INDEPENDENT_AMBULATORY_CARE_PROVIDER_SITE_OTHER): Payer: Medicare Other | Admitting: Internal Medicine

## 2010-09-17 DIAGNOSIS — E538 Deficiency of other specified B group vitamins: Secondary | ICD-10-CM

## 2010-09-17 MED ORDER — CYANOCOBALAMIN 1000 MCG/ML IJ SOLN
1000.0000 ug | Freq: Once | INTRAMUSCULAR | Status: AC
  Administered 2010-09-17: 1000 ug via INTRAMUSCULAR

## 2010-09-24 ENCOUNTER — Ambulatory Visit (INDEPENDENT_AMBULATORY_CARE_PROVIDER_SITE_OTHER): Payer: Medicare Other | Admitting: Internal Medicine

## 2010-09-24 DIAGNOSIS — E538 Deficiency of other specified B group vitamins: Secondary | ICD-10-CM

## 2010-09-24 DIAGNOSIS — Z0279 Encounter for issue of other medical certificate: Secondary | ICD-10-CM

## 2010-09-24 MED ORDER — CYANOCOBALAMIN 1000 MCG/ML IJ SOLN
1000.0000 ug | Freq: Once | INTRAMUSCULAR | Status: AC
  Administered 2010-09-24: 1000 ug via INTRAMUSCULAR

## 2010-09-28 ENCOUNTER — Ambulatory Visit (INDEPENDENT_AMBULATORY_CARE_PROVIDER_SITE_OTHER): Payer: Medicare Other | Admitting: Internal Medicine

## 2010-09-28 ENCOUNTER — Telehealth: Payer: Self-pay | Admitting: *Deleted

## 2010-09-28 ENCOUNTER — Encounter: Payer: Self-pay | Admitting: Internal Medicine

## 2010-09-28 DIAGNOSIS — R42 Dizziness and giddiness: Secondary | ICD-10-CM

## 2010-09-28 DIAGNOSIS — M810 Age-related osteoporosis without current pathological fracture: Secondary | ICD-10-CM

## 2010-09-28 DIAGNOSIS — M7989 Other specified soft tissue disorders: Secondary | ICD-10-CM

## 2010-09-28 NOTE — Telephone Encounter (Signed)
Appointment today @ 3:30pm with Dr. Kirtland Bouchard

## 2010-09-28 NOTE — Telephone Encounter (Signed)
Pt requesting work in appt with Dr. Fabian Sharp today, c/o of leg swelling x 2 weeks.  Pt thinks it may be related to all the medication she is taking and wants to review medications with Dr. Fabian Sharp.

## 2010-09-28 NOTE — Progress Notes (Signed)
  Subjective:    Patient ID: Vanessa Keller, female    DOB: 04/08/1916, 77 y.o.   MRN: [id number]  HPI  77 year old patient who has treated hypothyroidism who presents with a two-week history of swelling involving the left ankle. There has been no pain or trauma. She also does describe some mild numbness of the foot. She has had complaints of leg swelling in the past. She states she hears today low-salt diet    Review of Systems  Constitutional: Negative.   HENT: Negative for hearing loss, congestion, sore throat, rhinorrhea, dental problem, sinus pressure and tinnitus.   Eyes: Negative for pain, discharge and visual disturbance.  Respiratory: Negative for cough and shortness of breath.   Cardiovascular: Positive for leg swelling. Negative for chest pain and palpitations.  Gastrointestinal: Negative for nausea, vomiting, abdominal pain, diarrhea, constipation, blood in stool and abdominal distention.  Genitourinary: Negative for dysuria, urgency, frequency, hematuria, flank pain, vaginal bleeding, vaginal discharge, difficulty urinating, vaginal pain and pelvic pain.  Musculoskeletal: Negative for joint swelling, arthralgias and gait problem.  Skin: Negative for rash.  Neurological: Negative for dizziness, syncope, speech difficulty, weakness, numbness and headaches.  Hematological: Negative for adenopathy.  Psychiatric/Behavioral: Negative for behavioral problems, dysphoric mood and agitation. The patient is not nervous/anxious.        Objective:   Physical Exam  Constitutional: She is oriented to person, place, and time. She appears well-developed and well-nourished.  HENT:  Head: Normocephalic.  Right Ear: External ear normal.  Left Ear: External ear normal.  Mouth/Throat: Oropharynx is clear and moist.  Eyes: Conjunctivae and EOM are normal. Pupils are equal, round, and reactive to light.  Neck: Normal range of motion. Neck supple. No thyromegaly present.  Cardiovascular: Normal  rate, regular rhythm, normal heart sounds and intact distal pulses.   Pulmonary/Chest: Effort normal and breath sounds normal. She has no rales.  Abdominal: Soft. Bowel sounds are normal. She exhibits no mass. There is no tenderness.  Musculoskeletal: Normal range of motion.       There was minimal left ankle edema;  pedal pulses on the left are  full. Foot was well perfused  Lymphadenopathy:    She has no cervical adenopathy.  Neurological: She is alert and oriented to person, place, and time.  Skin: Skin is warm and dry. No rash noted.  Psychiatric: She has a normal mood and affect. Her behavior is normal.          Assessment & Plan:   Left ankle edema. Probably mild venous insufficiency;  no evidence of significant pathology  We'll stress elevation and to restrict salt diet Return when necessary or as scheduled for general followup

## 2010-09-28 NOTE — Patient Instructions (Signed)
Limit your sodium (Salt) intake  Keep left leg elevated as much as possible  Call or return to clinic prn if these symptoms worsen or fail to improve as anticipated.  

## 2010-09-30 ENCOUNTER — Telehealth: Payer: Self-pay | Admitting: Internal Medicine

## 2010-09-30 NOTE — Telephone Encounter (Signed)
Md is still working on the form. Pt aware of this and we will give her a call as soon as possible once we have the form ready for pick up.

## 2010-09-30 NOTE — Telephone Encounter (Signed)
Pt called req status of form that pt sent to Dr Fabian Sharp re: pt trying to get in to [facility]. Pt is req a call back asap. Needs to get completed and signed form back asap.

## 2010-10-06 ENCOUNTER — Telehealth: Payer: Self-pay | Admitting: *Deleted

## 2010-10-06 NOTE — Telephone Encounter (Signed)
error 

## 2010-10-07 ENCOUNTER — Telehealth: Payer: Self-pay | Admitting: Internal Medicine

## 2010-10-07 NOTE — Telephone Encounter (Signed)
Pt req status of form from [facility]. Need this asap today. Pls call pts sister, Silvana Newness when ready for pick up. Very Important.

## 2010-10-07 NOTE — Telephone Encounter (Signed)
Pt aware and form is ready to pick up.

## 2010-10-07 NOTE — Telephone Encounter (Signed)
done

## 2010-10-08 ENCOUNTER — Ambulatory Visit (INDEPENDENT_AMBULATORY_CARE_PROVIDER_SITE_OTHER): Payer: Medicare Other | Admitting: Internal Medicine

## 2010-10-08 DIAGNOSIS — E538 Deficiency of other specified B group vitamins: Secondary | ICD-10-CM

## 2010-10-08 MED ORDER — CYANOCOBALAMIN 1000 MCG/ML IJ SOLN
1000.0000 ug | Freq: Once | INTRAMUSCULAR | Status: AC
  Administered 2010-10-08: 1000 ug via INTRAMUSCULAR

## 2010-10-14 ENCOUNTER — Ambulatory Visit: Payer: Medicare Other | Admitting: Family Medicine

## 2010-10-14 ENCOUNTER — Other Ambulatory Visit: Payer: Self-pay | Admitting: Endocrinology

## 2010-10-15 ENCOUNTER — Ambulatory Visit: Payer: Medicare Other | Admitting: Internal Medicine

## 2010-10-18 ENCOUNTER — Ambulatory Visit (INDEPENDENT_AMBULATORY_CARE_PROVIDER_SITE_OTHER): Payer: Medicare Other | Admitting: Internal Medicine

## 2010-10-18 DIAGNOSIS — E538 Deficiency of other specified B group vitamins: Secondary | ICD-10-CM

## 2010-10-18 MED ORDER — CYANOCOBALAMIN 1000 MCG/ML IJ SOLN
1000.0000 ug | Freq: Once | INTRAMUSCULAR | Status: AC
  Administered 2010-10-18: 1000 ug via INTRAMUSCULAR

## 2010-10-22 ENCOUNTER — Ambulatory Visit: Payer: Medicare Other | Admitting: Internal Medicine

## 2010-10-25 ENCOUNTER — Ambulatory Visit: Payer: Medicare Other | Admitting: Internal Medicine

## 2010-10-25 ENCOUNTER — Ambulatory Visit (INDEPENDENT_AMBULATORY_CARE_PROVIDER_SITE_OTHER): Payer: Medicare Other | Admitting: Internal Medicine

## 2010-10-25 DIAGNOSIS — E538 Deficiency of other specified B group vitamins: Secondary | ICD-10-CM

## 2010-10-25 MED ORDER — CYANOCOBALAMIN 1000 MCG/ML IJ SOLN
1000.0000 ug | Freq: Once | INTRAMUSCULAR | Status: AC
  Administered 2010-10-25: 1000 ug via INTRAMUSCULAR

## 2011-03-02 ENCOUNTER — Other Ambulatory Visit: Payer: Self-pay | Admitting: Endocrinology

## 2011-04-27 ENCOUNTER — Telehealth: Payer: Self-pay | Admitting: Internal Medicine

## 2011-04-27 NOTE — Telephone Encounter (Signed)
Spoke to pt- shoulder pain. Someone told her that she could get a shot in her shoulder. She has had the shoulder pain x 2 weeks. Not  severe. No injury. Pt to come in tomorrow am.

## 2011-04-27 NOTE — Telephone Encounter (Signed)
Has shoulder pain and wants to get a shot. Please advise patient and she wants Carollee Herter to return call. Thanks.

## 2011-04-28 ENCOUNTER — Encounter: Payer: Self-pay | Admitting: Family

## 2011-04-28 ENCOUNTER — Ambulatory Visit (INDEPENDENT_AMBULATORY_CARE_PROVIDER_SITE_OTHER): Payer: Medicare Other | Admitting: Family

## 2011-04-28 VITALS — BP 140/80 | Temp 98.7°F | Wt 113.0 lb

## 2011-04-28 DIAGNOSIS — M19019 Primary osteoarthritis, unspecified shoulder: Secondary | ICD-10-CM

## 2011-04-28 DIAGNOSIS — M25519 Pain in unspecified shoulder: Secondary | ICD-10-CM

## 2011-04-28 DIAGNOSIS — M25511 Pain in right shoulder: Secondary | ICD-10-CM

## 2011-04-28 MED ORDER — KETOROLAC TROMETHAMINE 60 MG/2ML IM SOLN
30.0000 mg | Freq: Once | INTRAMUSCULAR | Status: AC
  Administered 2011-04-28: 30 mg via INTRAMUSCULAR

## 2011-04-28 MED ORDER — KETOROLAC TROMETHAMINE 60 MG/2ML IM SOLN: 60.0000 mg | Freq: Once | INTRAMUSCULAR | Status: DC

## 2011-04-28 NOTE — Progress Notes (Signed)
Subjective:    Patient ID: Vanessa Keller, female    DOB: 12-Sep-1915, 78 y.o.   MRN: [id number]  HPI 78 year old white female, patient of Dr. Fabian Sharp is in today with complaints of right shoulder sore. The pain has become more sore over the last couple months. She is unable to clearly rate the pain level. No alleviating factors. She has not taken any medication for it. Pain is slightly worse with movement. Denies any numbness or tingling.    Review of Systems  Constitutional: Negative.   Respiratory: Negative.   Cardiovascular: Negative.   Gastrointestinal: Negative.   Skin: Negative.   Neurological: Negative.   Hematological: Negative.   Psychiatric/Behavioral: Negative.    Past Medical History  Diagnosis Date  . Hypothyroidism   . Osteoporosis   . Urinary urgency   . Urinary incontinence   . Hearing loss     wears hearing aids  . Secondary hyperparathyroidism     History   Social History  . Marital Status: Widowed    Spouse Name: N/A    Number of Children: N/A  . Years of Education: N/A   Occupational History  . Not on file.   Social History Main Topics  . Smoking status: Never Smoker   . Smokeless tobacco: Not on file  . Alcohol Use: Not on file  . Drug Use: Not on file  . Sexually Active: Not on file   Other Topics Concern  . Not on file   Social History Narrative   WidowedChild died age 26 yearsPlays bridge without problem3 meals a day prepared for herM[facility]     Past Surgical History  Procedure Date  . Removal of carotid tumor 1997    left sx nonmalignant    No family history on file.  No Known Allergies  Current Outpatient Prescriptions on File Prior to Visit  Medication Sig Dispense Refill  . levothyroxine (SYNTHROID, LEVOTHROID) 75 MCG tablet TAKE ONE TABLET BY MOUTH DAILY  60 tablet  3  . oxybutynin (DITROPAN) 5 MG tablet TAKE ONE-HALF TABLET TWICE A DAY  30 tablet  5  . calcitRIOL (ROCALTROL) 0.25 MCG capsule TAKE ONE  CAPSULE DAILY  30 capsule  10  . calcium gluconate 500 MG tablet Take 500 mg by mouth daily.        . Cyanocobalamin (VITAMIN B 12 PO) Take by mouth.         No current facility-administered medications on file prior to visit.    BP 140/80  Temp(Src) 98.7 F (37.1 C) (Oral)  Wt 113 lb (51.256 kg)chart    Objective:   Physical Exam  Constitutional: She is oriented to person, place, and time. She appears well-developed and well-nourished.  Neck: Neck supple.  Cardiovascular: Normal rate, regular rhythm and normal heart sounds.   Pulmonary/Chest: Effort normal and breath sounds normal.  Musculoskeletal: Normal range of motion.       Very mild tenderness to palpation to the right shoulder. Full ROM no discomfort.   Neurological: She is alert and oriented to person, place, and time.  Skin: Skin is warm and dry.  Psychiatric: She has a normal mood and affect.          Assessment & Plan:  Assessment: Right Shoulder Pain, Likely-Osteoarthritis.   Plan: Toradol 30 mg IM x1. Encouraged x-ray of the right shoulder. And she was requesting a joint injection today however explained that we really need to have an image of her shoulder before we injected.-Easily  affected area patient to call the office symptoms worsen or persist. Recheck a schedule, and when necessary.

## 2011-07-26 ENCOUNTER — Other Ambulatory Visit: Payer: Self-pay | Admitting: Endocrinology

## 2011-09-23 ENCOUNTER — Encounter: Payer: Self-pay | Admitting: Internal Medicine

## 2011-09-23 ENCOUNTER — Ambulatory Visit (INDEPENDENT_AMBULATORY_CARE_PROVIDER_SITE_OTHER): Payer: Medicare Other | Admitting: Internal Medicine

## 2011-09-23 VITALS — BP 116/74 | HR 77 | Temp 97.8°F | Wt 107.0 lb

## 2011-09-23 DIAGNOSIS — M25551 Pain in right hip: Secondary | ICD-10-CM | POA: Insufficient documentation

## 2011-09-23 DIAGNOSIS — E559 Vitamin D deficiency, unspecified: Secondary | ICD-10-CM

## 2011-09-23 DIAGNOSIS — E538 Deficiency of other specified B group vitamins: Secondary | ICD-10-CM

## 2011-09-23 DIAGNOSIS — E039 Hypothyroidism, unspecified: Secondary | ICD-10-CM

## 2011-09-23 DIAGNOSIS — M25559 Pain in unspecified hip: Secondary | ICD-10-CM

## 2011-09-23 DIAGNOSIS — M810 Age-related osteoporosis without current pathological fracture: Secondary | ICD-10-CM

## 2011-09-23 LAB — CBC WITH DIFFERENTIAL/PLATELET
Basophils Absolute: 0 10*3/uL (ref 0.0–0.1)
Hemoglobin: 13.5 g/dL (ref 12.0–15.0)
Lymphocytes Relative: 25 % (ref 12.0–46.0)
Monocytes Relative: 6.2 % (ref 3.0–12.0)
Neutro Abs: 5.9 10*3/uL (ref 1.4–7.7)
RDW: 13.6 % (ref 11.5–14.6)

## 2011-09-23 LAB — BASIC METABOLIC PANEL
Calcium: 9.1 mg/dL (ref 8.4–10.5)
GFR: 88.74 mL/min (ref >60.00)
Glucose, Bld: 76 mg/dL (ref 70–99)
Sodium: 143 mEq/L (ref 135–145)

## 2011-09-23 LAB — TSH: TSH: 0.21 u[IU]/mL — ABNORMAL LOW (ref 0.35–5.50)

## 2011-09-23 MED ORDER — LEVOTHYROXINE SODIUM 75 MCG PO TABS: 75.0000 ug | ORAL_TABLET | Freq: Every day | ORAL | Status: DC

## 2011-09-23 NOTE — Patient Instructions (Signed)
Will notify you  of labs when available. I refilled the medication for thyroid today. I am worried about your hip pain. It could be arthritis but you could also have an early crack in the bone.  We will get orthopedics to see you for an opinion.

## 2011-09-23 NOTE — Progress Notes (Signed)
  Subjective:    Patient ID: Vanessa Keller, female    DOB: [birth year], 78 y.o.   MRN: [id number]  HPI Patient comes in today for SDA for  new problem evaluation. Last ov with me 5  2012  She comes in today with her sister. Who  is.  was driving. She had the onset of week ago of right hip pain lateral and anterior hurts when she walks and steps on her leg. She denies any fall or history of same. It is not painful to touch or Ln. on but to take steps. Last visit with me was a year ago. She has hypothyroidism and is out of her medicine for 2-3 days. She has a history of osteoporosis by bone density but is not on medicine and has had no fractures. Her sister is driving today. She's not able to do her normal activities housework. Denies any fever.  Review of Systems No chest pain shortness of breath bleeding. No history of this similar pain Past history family history social history reviewed in the electronic medical record. Outpatient Encounter Prescriptions as of 09/23/2011  Medication Sig Dispense Refill  . calcitRIOL (ROCALTROL) 0.25 MCG capsule TAKE ONE CAPSULE DAILY  30 capsule  10  . calcium gluconate 500 MG tablet Take 500 mg by mouth daily.        . Cyanocobalamin (VITAMIN B 12 PO) Take by mouth.        . levothyroxine (SYNTHROID, LEVOTHROID) 75 MCG tablet Take 1 tablet (75 mcg total) by mouth daily.  60 tablet  0  . oxybutynin (DITROPAN) 5 MG tablet TAKE ONE-HALF TABLET TWICE A DAY  30 tablet  5  . DISCONTD: levothyroxine (SYNTHROID, LEVOTHROID) 75 MCG tablet TAKE ONE TABLET BY MOUTH DAILY  60 tablet  0        Objective:   Physical Exam  BP 116/74  Pulse 77  Temp 97.8 F (36.6 C) (Oral)  Wt 107 lb (48.535 kg)  SpO2 96% Well-developed elderly alert mildly hard of hearing cognitively intact in no acute distress sitting in wheelchair. Cardiovascular or pulmonary no acute changes regular rhythm Extremities OA changes no significant edema. Abdomen standing soft no obvious hernia  noted. Musculoskeletal right hip no swelling redness or warmth no point tenderness but she points to the hip to groin area lateral prominence.  Has to hold on to walk and very antalgic gait.  Lab Results  Component Value Date   TSH 0.23* 08/16/2010      Assessment & Plan: Right hip pain   Right Hip pain new onset with ambulation   Hip pain right new onset without trauma in high risk patient  Only hurts with ambulation and cannot step easliy. Today is Friday   Would try to arrange Ortho to see her today  Needs x ray and evaluation about ambulation.   Hypothyroidism  out of medication  presumed was being monitored by Dr. Everardo All but apparently not. Hx of b12 deficiency  hearing loss  We'll refill today and plan  lab work.

## 2011-09-24 LAB — VITAMIN D 25 HYDROXY (VIT D DEFICIENCY, FRACTURES): Vit D, 25-Hydroxy: 27 ng/mL — ABNORMAL LOW (ref 30–89)

## 2011-09-26 LAB — SEDIMENTATION RATE: Sed Rate: 8 mm/hr (ref 0–22)

## 2011-09-30 ENCOUNTER — Other Ambulatory Visit: Payer: Self-pay | Admitting: Family Medicine

## 2011-09-30 MED ORDER — LEVOTHYROXINE SODIUM 50 MCG PO TABS: 50.0000 ug | ORAL_TABLET | Freq: Every day | ORAL | Status: DC

## 2011-10-10 ENCOUNTER — Ambulatory Visit (INDEPENDENT_AMBULATORY_CARE_PROVIDER_SITE_OTHER): Payer: Medicare Other | Admitting: Internal Medicine

## 2011-10-10 ENCOUNTER — Encounter: Payer: Self-pay | Admitting: Internal Medicine

## 2011-10-10 VITALS — BP 112/60 | HR 86 | Temp 98.4°F | Wt 110.0 lb

## 2011-10-10 DIAGNOSIS — E538 Deficiency of other specified B group vitamins: Secondary | ICD-10-CM

## 2011-10-10 DIAGNOSIS — E039 Hypothyroidism, unspecified: Secondary | ICD-10-CM

## 2011-10-10 DIAGNOSIS — M25551 Pain in right hip: Secondary | ICD-10-CM

## 2011-10-10 DIAGNOSIS — M25559 Pain in unspecified hip: Secondary | ICD-10-CM

## 2011-10-10 DIAGNOSIS — E559 Vitamin D deficiency, unspecified: Secondary | ICD-10-CM

## 2011-10-10 DIAGNOSIS — H9190 Unspecified hearing loss, unspecified ear: Secondary | ICD-10-CM

## 2011-10-10 DIAGNOSIS — R413 Other amnesia: Secondary | ICD-10-CM

## 2011-10-10 NOTE — Patient Instructions (Addendum)
Get  appt  To get TSH  Vit b12 and vit d  done in   4- 6 weeks.  Then will let you know results .    Get some sort of emergency notification if you fall  Or need help. Carry phone with you.   Consider looking into friends home .    Get a health care power of attorney

## 2011-10-10 NOTE — Progress Notes (Signed)
  Subjective:    Patient ID: Vanessa Keller, female    DOB: 1915/05/16, 78 y.o.   MRN: [id number]  HPI Pt comes back in with sister  After last visit .Has seen  and no surgery needed.  Did get the message to change to thyroid med and  Taking the calcitrol now.  Sister had concerns about memory and safety with her living alone and also  Keeping up with finances.  Pt however not that concerned.  Review of Systems No cp sob  Current falls  No cp sob bleeding  Ha  Or vision changes   Past history family history social history reviewed in the electronic medical record. Lives alone as does sis alos has health problems  Sis thinks she should look to friends home cause she can afford it.   Past history family history social history reviewed in the electronic medical record. Outpatient Encounter Prescriptions as of 10/10/2011  Medication Sig Dispense Refill  . calcitRIOL (ROCALTROL) 0.25 MCG capsule TAKE ONE CAPSULE DAILY  30 capsule  10  . Cyanocobalamin (VITAMIN B 12 PO) Take by mouth.        . levothyroxine (SYNTHROID, LEVOTHROID) 50 MCG tablet Take 1 tablet (50 mcg total) by mouth daily.  90 tablet  1  . DISCONTD: calcium gluconate 500 MG tablet Take 500 mg by mouth daily.        Marland Kitchen DISCONTD: oxybutynin (DITROPAN) 5 MG tablet TAKE ONE-HALF TABLET TWICE A DAY  30 tablet  5       Objective:   Physical Exam BP 112/60  Pulse 86  Temp 98.4 F (36.9 C) (Oral)  Wt 110 lb (49.896 kg)  SpO2 97% WDWN hard of hearing but in nad oriented to time person and place except day of week.  Gait much improved walking with a cane . Tried to do ?s  And declined clock drawing.  No tremor. Lab Results  Component Value Date   TSH 0.21* 09/23/2011   Lab Results  Component Value Date   VITAMINB12 704 09/23/2011      Assessment & Plan:  SP hip pain  Evaluation  I still dont have the final report but no need for surgery other intervention.  Will try to obtain report . Hypothyroid  Hearing loss  Vit d and b12   On supplements  Memory problems  Per sister report although pat reported in the past but not that concerned  but pt not to now inclined to change  things but advised to consider living situation and  Help if needed. Disc safety and  Contact  Plan if fall or other  Medical event.  Thyroid  Continue change in med and make sure get fu  Labs.  Unsure that this was  acknowleged over the phone by the  CMA   Total visit > 50% spent counseling and coordinating care

## 2011-11-29 ENCOUNTER — Ambulatory Visit (INDEPENDENT_AMBULATORY_CARE_PROVIDER_SITE_OTHER): Payer: Medicare Other | Admitting: Internal Medicine

## 2011-11-29 ENCOUNTER — Encounter: Payer: Self-pay | Admitting: Internal Medicine

## 2011-11-29 VITALS — BP 124/80 | HR 94 | Temp 99.3°F | Wt 108.0 lb

## 2011-11-29 DIAGNOSIS — E538 Deficiency of other specified B group vitamins: Secondary | ICD-10-CM

## 2011-11-29 DIAGNOSIS — R413 Other amnesia: Secondary | ICD-10-CM

## 2011-11-29 DIAGNOSIS — E559 Vitamin D deficiency, unspecified: Secondary | ICD-10-CM

## 2011-11-29 DIAGNOSIS — E039 Hypothyroidism, unspecified: Secondary | ICD-10-CM | POA: Insufficient documentation

## 2011-11-29 DIAGNOSIS — M810 Age-related osteoporosis without current pathological fracture: Secondary | ICD-10-CM

## 2011-11-29 DIAGNOSIS — N2581 Secondary hyperparathyroidism of renal origin: Secondary | ICD-10-CM

## 2011-11-29 DIAGNOSIS — H9190 Unspecified hearing loss, unspecified ear: Secondary | ICD-10-CM

## 2011-11-29 NOTE — Patient Instructions (Signed)
Stay on the synthroid ( thyroid medication ) unless we tell you otherwise. Also the Recalcitrol which helps the vitamin d level and keep your bones from becoming more fragile.  Stay on the b12 supplement   To keep the level from dropping to a lower level (which can cause mental fogginess.)  Can stop the biotin   Unsure if this is helping   Usually used to help hair and nails.  Will notify you  of labs when available.  and make any changes then .   If every thing is ok then can recheck in 6 months.

## 2011-11-29 NOTE — Progress Notes (Signed)
  Subjective:    Patient ID: Vanessa Keller, female    DOB: Feb 15, 1916, 78 y.o.   MRN: [id number]  HPI Patient comes in today for follow up of  multiple medical problems.  Thyorid now on synthroud didn't get  Labs pre visit. On recalcitrol and biotin and vit b12 200 1/2 bid  Asks if she needs to be on all of this  .  Here by self today  Still lives alone but doing ok . No change in status.  No cv pulm sx today and hip is better  No falls further  . Appetite good and no nvd blood in stool.   Review of Systems No cp sob syncope   Feels ok   Hard of hearing  Doesn't think memory changing more.  Past history family history social history reviewed in the electronic medical record.     Objective:   Physical Exam Wt Readings from Last 3 Encounters:  11/29/11 108 lb (48.988 kg)  10/10/11 110 lb (49.896 kg)  09/23/11 107 lb (48.535 kg)  WDWN in nad  Gait minimally antalgic and steady  Neck: Supple without adenopathy or masses or bruits Chest:  Clear to A&P without wheezes rales or rhonchi CV:  S1-S2 no gallops or murmurs  Has RR peripheral perfusion is normal extr no edema and OA changes no redness.  Skin no bruising or bleeding  Outpatient Encounter Prescriptions as of 11/29/2011  Medication Sig Dispense Refill  . calcitRIOL (ROCALTROL) 0.25 MCG capsule TAKE ONE CAPSULE DAILY  30 capsule  10  . Cyanocobalamin (VITAMIN B 12 PO) Take by mouth.        . levothyroxine (SYNTHROID, LEVOTHROID) 50 MCG tablet Take 1 tablet (50 mcg total) by mouth daily.  90 tablet  1   reveiwed labs and data in the ehr from last labs      Assessment & Plan:   Hypothyroid  Needs recheck tsh after dose change  tsh was low last visit   B12 supp  Better level  But need recheck   Secondary hyperparathyroid.   On recalcitrol check vit d level. Now taking   Pt  Disc at length not wanting to take meds unless absolutely necessary .  Feels ok  But concern about losing weight  We reviewed data and weight from the ehr.    She seems to have some diffuseness of memory having to repeat instructions a number of times   And her djd hip pain is a lot better without needing a cane today .  Unsure why she is on the biotin and she isnt either.   Unfortunately she had to wait  A long time in the office apologized  For the wait.  Lab today and then notify of results and rx plan  Total visit > 50% spent counseling and coordinating care

## 2011-11-30 LAB — VITAMIN D 25 HYDROXY (VIT D DEFICIENCY, FRACTURES): Vit D, 25-Hydroxy: 27 ng/mL — ABNORMAL LOW (ref 30–89)

## 2011-12-06 ENCOUNTER — Encounter: Payer: Self-pay | Admitting: Family Medicine

## 2011-12-06 ENCOUNTER — Other Ambulatory Visit: Payer: Self-pay | Admitting: *Deleted

## 2011-12-06 MED ORDER — CALCITRIOL 0.25 MCG PO CAPS: ORAL_CAPSULE | ORAL | Status: DC

## 2011-12-07 ENCOUNTER — Encounter: Payer: Self-pay | Admitting: Endocrinology

## 2011-12-07 ENCOUNTER — Ambulatory Visit (INDEPENDENT_AMBULATORY_CARE_PROVIDER_SITE_OTHER): Payer: Medicare Other | Admitting: Endocrinology

## 2011-12-07 VITALS — BP 102/62 | HR 72 | Temp 98.3°F | Ht 66.0 in | Wt 110.0 lb

## 2011-12-07 DIAGNOSIS — N2581 Secondary hyperparathyroidism of renal origin: Secondary | ICD-10-CM

## 2011-12-07 NOTE — Patient Instructions (Addendum)
blood tests are being requested for you today.  You will receive a letter with results. it is critically important to prevent falling down (keep floor areas well-lit, dry, and free of loose objects.  If you have a cane, walker, or wheelchair, you should use it, even for short trips around the house.  Also, try not to rush). Please come back for a follow-up appointment in 6 months

## 2011-12-07 NOTE — Progress Notes (Signed)
  Subjective:    Patient ID: Vanessa Keller, female    DOB: 1916/01/31, 78 y.o.   MRN: [id number]  HPI pt has h/o secondary hyperparathyroidism, of uncertain etiology.  she is uncertain how long ago she ran out of rocaltrol.  no recent falls or bony fx.   Past Medical History  Diagnosis Date  . Hypothyroidism   . Osteoporosis   . Urinary urgency   . Urinary incontinence   . Hearing loss     wears hearing aids  . Secondary hyperparathyroidism     Past Surgical History  Procedure Date  . Removal of carotid tumor 1997    left sx nonmalignant    History   Social History  . Marital Status: Widowed    Spouse Name: N/A    Number of Children: N/A  . Years of Education: N/A   Occupational History  . Not on file.   Social History Main Topics  . Smoking status: Never Smoker   . Smokeless tobacco: Not on file  . Alcohol Use: Not on file  . Drug Use: Not on file  . Sexually Active: Not on file   Other Topics Concern  . Not on file   Social History Narrative   WidowedChild died age 35 yearsPlays bridge without problem3 meals a day prepared for [facility]     Current Outpatient Prescriptions on File Prior to Visit  Medication Sig Dispense Refill  . calcitRIOL (ROCALTROL) 0.25 MCG capsule TAKE ONE CAPSULE DAILY  30 capsule  0  . Cyanocobalamin (VITAMIN B 12 PO) Take by mouth.        . levothyroxine (SYNTHROID, LEVOTHROID) 50 MCG tablet Take 1 tablet (50 mcg total) by mouth daily.  90 tablet  1    No Known Allergies  No family history on file.  BP 102/62  Pulse 72  Temp 98.3 F (36.8 C) (Oral)  Ht 5\' 6"  (1.676 m)  Wt 110 lb (49.896 kg)  BMI 17.75 kg/m2  SpO2 95%   Review of Systems Denies cramps    Objective:   Physical Exam VITAL SIGNS:  See vs page GENERAL: no distress MSK: no deformity of the hands, except oa changes    Assessment & Plan:  Secondary hyperparathyroidism, on rx

## 2011-12-29 ENCOUNTER — Other Ambulatory Visit: Payer: Self-pay | Admitting: Internal Medicine

## 2012-01-05 ENCOUNTER — Telehealth: Payer: Self-pay | Admitting: Family Medicine

## 2012-01-05 NOTE — Telephone Encounter (Signed)
Pt requesting a refill on levothyroxine (SYNTHROID, LEVOTHROID) 50 MCG tablet [facility] send to [facility] [address]. Thanks!

## 2012-01-05 NOTE — Telephone Encounter (Signed)
Called the pharmacy and spoke with [place] who states that pt picked up #90 on 12/29/11 and has one refill left.  Called and left a message to make pt aware.

## 2012-01-11 ENCOUNTER — Other Ambulatory Visit: Payer: Self-pay | Admitting: Family Medicine

## 2012-01-11 MED ORDER — CALCITRIOL 0.25 MCG PO CAPS: ORAL_CAPSULE | ORAL | Status: DC

## 2012-02-15 ENCOUNTER — Encounter: Payer: Self-pay | Admitting: Internal Medicine

## 2012-02-15 ENCOUNTER — Ambulatory Visit (INDEPENDENT_AMBULATORY_CARE_PROVIDER_SITE_OTHER): Payer: Medicare Other | Admitting: Internal Medicine

## 2012-02-15 VITALS — BP 112/66 | HR 83 | Temp 98.5°F | Ht 65.98 in | Wt 112.0 lb

## 2012-02-15 DIAGNOSIS — M810 Age-related osteoporosis without current pathological fracture: Secondary | ICD-10-CM

## 2012-02-15 DIAGNOSIS — E559 Vitamin D deficiency, unspecified: Secondary | ICD-10-CM

## 2012-02-15 DIAGNOSIS — R4189 Other symptoms and signs involving cognitive functions and awareness: Secondary | ICD-10-CM

## 2012-02-15 DIAGNOSIS — Z0000 Encounter for general adult medical examination without abnormal findings: Secondary | ICD-10-CM

## 2012-02-15 DIAGNOSIS — E039 Hypothyroidism, unspecified: Secondary | ICD-10-CM

## 2012-02-15 DIAGNOSIS — R413 Other amnesia: Secondary | ICD-10-CM

## 2012-02-15 DIAGNOSIS — E538 Deficiency of other specified B group vitamins: Secondary | ICD-10-CM

## 2012-02-15 DIAGNOSIS — N2581 Secondary hyperparathyroidism of renal origin: Secondary | ICD-10-CM

## 2012-02-15 DIAGNOSIS — H9190 Unspecified hearing loss, unspecified ear: Secondary | ICD-10-CM

## 2012-02-15 DIAGNOSIS — R419 Unspecified symptoms and signs involving cognitive functions and awareness: Secondary | ICD-10-CM

## 2012-02-15 LAB — CBC WITH DIFFERENTIAL/PLATELET
Basophils Absolute: 0 10*3/uL (ref 0.0–0.1)
Lymphocytes Relative: 32.8 % (ref 12.0–46.0)
Monocytes Relative: 7.3 % (ref 3.0–12.0)
Platelets: 224 10*3/uL (ref 150.0–400.0)
RDW: 13.6 % (ref 11.5–14.6)

## 2012-02-15 LAB — BASIC METABOLIC PANEL
BUN: 18 mg/dL (ref 6–23)
Calcium: 8.9 mg/dL (ref 8.4–10.5)
Creatinine, Ser: 0.7 mg/dL (ref 0.4–1.2)
GFR: 85.66 mL/min (ref >60.00)
Glucose, Bld: 75 mg/dL (ref 70–99)

## 2012-02-15 LAB — TSH: TSH: 3.45 u[IU]/mL (ref 0.35–5.50)

## 2012-02-15 LAB — VITAMIN B12: Vitamin B-12: 756 pg/mL (ref 211–911)

## 2012-02-15 NOTE — Patient Instructions (Signed)
Will notify you  of labs when available. ANd advise you about doses of medication. You  May need to go back on the calcitrol.  Consider taking medication for memory . NOthing fixes memory problem but sometimes helps with mental clarity  Dementia Dementia is a general term for problems with brain function. A person with dementia has memory loss and a hard time with at least one other brain function such as thinking, speaking, or problem solving. Dementia can affect social functioning, how you do your job, your mood, or your personality. The changes may be hidden for a long time. The earliest forms of this disease are usually not detected by family or friends. Dementia can be:  Irreversible.  Potentially reversible.  Partially reversible.  Progressive. This means it can get worse over time. CAUSES  Irreversible dementia causes may include:  Degeneration of brain cells (Alzheimer's disease or lewy body dementia).  Multiple small strokes (vascular dementia).  Infection (chronic meningitis or Creutzfelt-Jakob disease).  Frontotemporal dementia. This affects younger people, age 18 to 44, compared to those who have Alzheimer's disease.  Dementia associated with other disorders like Parkinson's disease, Huntington's disease, or HIV-associated dementia. Potentially or partially reversible dementia causes may include:  Medicines.  Metabolic causes such as excessive alcohol intake, vitamin B12 deficiency, or thyroid disease.  Masses or pressure in the brain such as a tumor, blood clot, or hydrocephalus. SYMPTOMS  Symptoms are often hard to detect. Family members or coworkers may not notice them early in the disease process. Different people with dementia may have different symptoms. Symptoms can include:  A hard time with memory, especially recent memory. Long-term memory may not be impaired.  Asking the same question multiple times or forgetting something someone just said.  A hard  time speaking your thoughts or finding certain words.  A hard time solving problems or performing familiar tasks (such as how to use a telephone).  Sudden changes in mood.  Changes in personality, especially increasing moodiness or mistrust.  Depression.  A hard time understanding complex ideas that were never a problem in the past. DIAGNOSIS  There are no specific tests for dementia.   Your caregiver may recommend a thorough evaluation. This is because some forms of dementia can be reversible. The evaluation will likely include a physical exam and getting a detailed history from you and a family member. The history often gives the best clues and suggestions for a diagnosis.  Memory testing may be done. A detailed brain function evaluation called neuropsychologic testing may be helpful.  Lab tests and brain imaging (such as a CT scan or MRI scan) are sometimes important.  Sometimes observation and re-evaluation over time is very helpful. TREATMENT  Treatment depends on the cause.   If the problem is a vitamin deficiency, it may be helped or cured with supplements.  For dementias such as Alzheimer's disease, medicines are available to stabilize or slow the course of the disease. There are no cures for this type of dementia.  Your caregiver can help direct you to groups, organizations, and other caregivers to help with decisions in the care of you or your loved one. HOME CARE INSTRUCTIONS The care of individuals with dementia is varied and dependent upon the progression of the dementia. The following suggestions are intended for the person living with, or caring for, the person with dementia.  Create a safe environment.  Remove the locks on bathroom doors to prevent the person from accidentally locking himself or herself in.  Use childproof latches on kitchen cabinets and any place where cleaning supplies, chemicals, or alcohol are kept.  Use childproof covers in unused electrical  outlets.  Install childproof devices to keep doors and windows secured.  Remove stove knobs or install safety knobs and an automatic shut-off on the stove.  Lower the temperature on water heaters.  Label medicines and keep them locked up.  Secure knives, lighters, matches, power tools, and guns, and keep these items out of reach.  Keep the house free from clutter. Remove rugs or anything that might contribute to a fall.  Remove objects that might break and hurt the person.  Make sure lighting is good, both inside and outside.  Install grab rails as needed.  Use a monitoring device to alert you to falls or other needs for help.  Reduce confusion.  Keep familiar objects and people around.  Use night lights or dim lights at night.  Label items or areas.  Use reminders, notes, or directions for daily activities or tasks.  Keep a simple, consistent routine for waking, meals, bathing, dressing, and bedtime.  Create a calm, quiet environment.  Place large clocks and calendars prominently.  Display emergency numbers and home address near all telephones.  Use cues to establish different times of the day. An example is to open curtains to let the natural light in during the day.   Use effective communication.  Choose simple words and short sentences.  Use a gentle, calm tone of voice.  Be careful not to interrupt.  If the person is struggling to find a word or communicate a thought, try to provide the word or thought.  Ask one question at a time. Allow the person ample time to answer questions. Repeat the question again if the person does not respond.  Reduce nighttime restlessness.  Provide a comfortable bed.  Have a consistent nighttime routine.  Ensure a regular walking or physical activity schedule. Involve the person in daily activities as much as possible.  Limit napping during the day.  Limit caffeine.  Attend social events that stimulate rather than  overwhelm the senses.  Encourage good nutrition and hydration.  Reduce distractions during meal times and snacks.  Avoid foods that are too hot or too cold.  Monitor chewing and swallowing ability.  Continue with routine vision, hearing, dental, and medical screenings.  Only give over-the-counter or prescription medicines as directed by the caregiver.  Monitor driving abilities. Do not allow the person to drive when safe driving is no longer possible.  Register with an identification program which could provide location assistance in the event of a missing person situation. SEEK MEDICAL CARE IF:   New behavioral problems start such as moodiness, aggressiveness, or seeing things that are not there (hallucinations).  Any new problem with brain function happens. This includes problems with balance, speech, or falling a lot.  Problems with swallowing develop.  Any symptoms of other illness happen. Small changes or worsening in any aspect of brain function can be a sign that the illness is getting worse. It can also be a sign of another medical illness such as infection. Seeing a caregiver right away is important. SEEK IMMEDIATE MEDICAL CARE IF:   A fever develops.  New or worsened confusion develops.  New or worsened sleepiness develops.  Staying awake becomes hard to do. Document Released: 09/21/2000 Document Revised: 06/20/2011 Document Reviewed: 08/23/2010 [facility] Patient Information 2013 [place], [place]. Marland Kitchen

## 2012-02-15 NOTE — Progress Notes (Signed)
Chief Complaint  Patient presents with  . Follow-up    Pt is not fasting. wellness visit     HPI: Patient comes in today for Preventive Health Care visit Medicare wellness and medication evaluation. She comes alone drives herself. Since her last visit she has had no major changes in her health she has continued problems with her memory but think she is doing okay continues to live alone feels that she can handle her finances writes her and checks will play bridge limits her driving 2 days and where she know she is going.  Wasn't sure why she had come today.    Hearing:  Heating aids  Needs new bateries. Only wears them when she noticed there is going to be people around  Vision:  No limitations at present . Wears  Glasses .  Safety:  Has smoke detector and wears seat belts.  No firearms.Sees dentist regularly.  Falls:  No falls   Denies balance problems. His slow rising from chairs at times but works outside  Advance directive :  Reviewed doesn't have one but her sister would be her first of can  Memory:  Doesn't feel it's changing that much has a hard time remembering certain things  Depression: No anhedonia unusual crying or depressive symptoms denies depression  Nutrition: Eats well balanced diet; adequate calcium and vitamin D. No swallowing chewiing problems.  Injury: no major injuries in the last six months.  Other healthcare providers:  Reviewed today .  Social:  Lives . Alone  No pets. Has an apartment [address] and a caretaker who she can ask for help next-door if needed  Preventive parameters: up-to-date on flu vaccine  ADLS:   She denies need for assistance  driving, feeding, obtaining food, dressing, toileting and bathing, managing money using phone. She is independent.    ROS:  GEN/ HEENT: No fever, significant weight changes sweats headaches vision problems hearing changes, CV/ PULM; No chest pain shortness of breath cough, syncope,edema  change in  exercise tolerance. GI /GU: No adominal pain, vomiting, change in bowel habits. No blood in the stool. No significant GU symptoms. SKIN/HEME: ,no acute skin rashes suspicious lesions or bleeding. No lymphadenopathy, nodules, masses.  NEURO/ PSYCH:  No  weakness numbness. No depression anxiety. IMM/ Allergy: No unusual infections.  Allergy .   REST of 12 system review negative except as per HPI   Past Medical History  Diagnosis Date  . Hypothyroidism   . Osteoporosis   . Urinary urgency   . Urinary incontinence   . Hearing loss     wears hearing aids  . Secondary hyperparathyroidism     No family history on file.  History   Social History  . Marital Status: Widowed    Spouse Name: N/A    Number of Children: N/A  . Years of Education: N/A   Social History Main Topics  . Smoking status: Never Smoker   . Smokeless tobacco: None  . Alcohol Use: None  . Drug Use: None  . Sexually Active: None   Other Topics Concern  . None   Social History Narrative   WidowedChild died age 13 yearsPlays bridge without problem3 meals a day prepared for herM[facility] Encounter Prescriptions as of 02/15/2012  Medication Sig Dispense Refill  . calcitRIOL (ROCALTROL) 0.25 MCG capsule TAKE ONE CAPSULE DAILY  30 capsule  5  . Cyanocobalamin (VITAMIN B 12 PO) Take by mouth.        Marland Kitchen  levothyroxine (SYNTHROID, LEVOTHROID) 50 MCG tablet TAKE ONE TABLET (50 MCG TOTAL) BY MOUTH DAILY.  90 tablet  1    EXAM: BP 112/66  Pulse 83  Temp 98.5 F (36.9 C) (Oral)  Ht 5' 5.98" (1.676 m)  Wt 112 lb (50.803 kg)  BMI 18.09 kg/m2  SpO2 96% Physical Exam: Vital signs reviewed elederly  female who appears her stated age in no acute distress.  Alert pleasant hard of hearing  Able to arise to table alone no gait assistance  HEENT: normocephalic atraumatic , Eyes: PERRL EOM's full, conjunctiva clear, Nares: paten,t no deformity discharge or tenderness., Ears: no deformity EAC's  clear TMs with normal landmarks.NECK: supple without masses, thyromegaly or bruits. CHEST/PULM:  Clear to auscultation and percussion breath sounds equal no wheeze , rales or rhonchi. No chest wall deformities or tenderness.  Kyphosis  Breast: normal by inspection . No dimpling, discharge, masses, tenderness or discharge . CV: PMI is nondisplaced, S1 S2 no gallops, murmurs, rubs. Peripheral pulses are full without delay.No JVD .  ABDOMEN: Bowel sounds normal nontender  No guard or rebound, no hepato splenomegal no CVA tenderness.  Extremtities:  No clubbing cyanosis or edema, no acute joint swelling or redness no focal atrophy OA change hands  NEURO:  Oriented tp year place day not month  Knows president , cranial nerves 3-12 appear to be intact,x hearing  no obvious focal weakness,gait within normal limits steady fora ge   No tremor or cogwheeling  SKIN: No acute rashes normal turgor, color, no bruising or petechiae. Dry skin  No depression anxiety seen  LN: no l adenopathy  Lab Results  Component Value Date   WBC 8.2 02/15/2012   HGB 13.2 02/15/2012   HCT 40.3 02/15/2012   PLT 224.0 02/15/2012   GLUCOSE 75 02/15/2012   CHOL 178 08/16/2010   TRIG 36.0 08/16/2010   HDL 62.60 08/16/2010   LDLCALC 108* 08/16/2010   ALT 25 10/30/2008   AST 34 10/30/2008   NA 141 02/15/2012   K 4.2 02/15/2012   CL 104 02/15/2012   CREATININE 0.7 02/15/2012   BUN 18 02/15/2012   CO2 30 02/15/2012   TSH 3.45 02/15/2012    ASSESSMENT AND PLAN: Preventive Health Care Counseled regarding healthy nutrition, exercise, sleep, injury prevention, calcium vit d and healthy weight . Fall prevention Ran out of calcitrol for a few day.  Prefers to limit medications  Will assess   Levels but told her she will probably need to stay on med.   Counseled as best possible. Think she understands directions . Extra time going over idrections Discussed the following assessment and plan:  1. Medicare annual wellness visit, subsequent    2.  HYPOTHYROIDISM  Basic metabolic panel, CBC with Differential, TSH, Vitamin D 25 hydroxy, Vitamin B12, PTH, intact and calcium   monitor level  3. VITAMIN B12 DEFICIENCY  Basic metabolic panel, CBC with Differential, TSH, Vitamin D 25 hydroxy, Vitamin B12, PTH, intact and calcium  4. VITAMIN D DEFICIENCY  Basic metabolic panel, CBC with Differential, TSH, Vitamin D 25 hydroxy, Vitamin B12, PTH, intact and calcium   out of med for  a few days check levels  5. SECONDARY HYPERPARATHYROIDISM  Basic metabolic panel, CBC with Differential, TSH, Vitamin D 25 hydroxy, Vitamin B12, PTH, intact and calcium  6. OSTEOPOROSIS  Basic metabolic panel, CBC with Differential, TSH, Vitamin D 25 hydroxy, Vitamin B12, PTH, intact and calcium  7. Hearing loss  Basic metabolic panel, CBC with Differential,  TSH, Vitamin D 25 hydroxy, Vitamin B12, PTH, intact and calcium   hearing aids prn adds to communication understanding  8. Memory problem  Basic metabolic panel, CBC with Differential, TSH, Vitamin D 25 hydroxy, Vitamin B12, PTH, intact and calcium   problematic some decline and has little patience for testing disc medication and monitoring will contact us inf intererested in medication.   9. Cognitive complaints  Basic metabolic panel, CBC with Differential, TSH, Vitamin D 25 hydroxy, Vitamin B12, PTH, intact and calcium     Patient Instructions  Will notify you  of labs when available. ANd advise you about doses of medication. You  May need to go back on the calcitrol.  Consider taking medication for memory . NOthing fixes memory problem but sometimes helps with mental clarity  Dementia Dementia is a general term for problems with brain function. A person with dementia has memory loss and a hard time with at least one other brain function such as thinking, speaking, or problem solving. Dementia can affect social functioning, how you do your job, your mood, or your personality. The changes may be hidden for a long  time. The earliest forms of this disease are usually not detected by family or friends. Dementia can be:  Irreversible.  Potentially reversible.  Partially reversible.  Progressive. This means it can get worse over time. CAUSES  Irreversible dementia causes may include:  Degeneration of brain cells (Alzheimer's disease or lewy body dementia).  Multiple small strokes (vascular dementia).  Infection (chronic meningitis or Creutzfelt-Jakob disease).  Frontotemporal dementia. This affects younger people, age 14 to 98, compared to those who have Alzheimer's disease.  Dementia associated with other disorders like Parkinson's disease, Huntington's disease, or HIV-associated dementia. Potentially or partially reversible dementia causes may include:  Medicines.  Metabolic causes such as excessive alcohol intake, vitamin B12 deficiency, or thyroid disease.  Masses or pressure in the brain such as a tumor, blood clot, or hydrocephalus. SYMPTOMS  Symptoms are often hard to detect. Family members or coworkers may not notice them early in the disease process. Different people with dementia may have different symptoms. Symptoms can include:  A hard time with memory, especially recent memory. Long-term memory may not be impaired.  Asking the same question multiple times or forgetting something someone just said.  A hard time speaking your thoughts or finding certain words.  A hard time solving problems or performing familiar tasks (such as how to use a telephone).  Sudden changes in mood.  Changes in personality, especially increasing moodiness or mistrust.  Depression.  A hard time understanding complex ideas that were never a problem in the past. DIAGNOSIS  There are no specific tests for dementia.   Your caregiver may recommend a thorough evaluation. This is because some forms of dementia can be reversible. The evaluation will likely include a physical exam and getting a detailed  history from you and a family member. The history often gives the best clues and suggestions for a diagnosis.  Memory testing may be done. A detailed brain function evaluation called neuropsychologic testing may be helpful.  Lab tests and brain imaging (such as a CT scan or MRI scan) are sometimes important.  Sometimes observation and re-evaluation over time is very helpful. TREATMENT  Treatment depends on the cause.   If the problem is a vitamin deficiency, it may be helped or cured with supplements.  For dementias such as Alzheimer's disease, medicines are available to stabilize or slow the course of the  disease. There are no cures for this type of dementia.  Your caregiver can help direct you to groups, organizations, and other caregivers to help with decisions in the care of you or your loved one. HOME CARE INSTRUCTIONS The care of individuals with dementia is varied and dependent upon the progression of the dementia. The following suggestions are intended for the person living with, or caring for, the person with dementia.  Create a safe environment.  Remove the locks on bathroom doors to prevent the person from accidentally locking himself or herself in.  Use childproof latches on kitchen cabinets and any place where cleaning supplies, chemicals, or alcohol are kept.  Use childproof covers in unused electrical outlets.  Install childproof devices to keep doors and windows secured.  Remove stove knobs or install safety knobs and an automatic shut-off on the stove.  Lower the temperature on water heaters.  Label medicines and keep them locked up.  Secure knives, lighters, matches, power tools, and guns, and keep these items out of reach.  Keep the house free from clutter. Remove rugs or anything that might contribute to a fall.  Remove objects that might break and hurt the person.  Make sure lighting is good, both inside and outside.  Install grab rails as needed.  Use  a monitoring device to alert you to falls or other needs for help.  Reduce confusion.  Keep familiar objects and people around.  Use night lights or dim lights at night.  Label items or areas.  Use reminders, notes, or directions for daily activities or tasks.  Keep a simple, consistent routine for waking, meals, bathing, dressing, and bedtime.  Create a calm, quiet environment.  Place large clocks and calendars prominently.  Display emergency numbers and home address near all telephones.  Use cues to establish different times of the day. An example is to open curtains to let the natural light in during the day.   Use effective communication.  Choose simple words and short sentences.  Use a gentle, calm tone of voice.  Be careful not to interrupt.  If the person is struggling to find a word or communicate a thought, try to provide the word or thought.  Ask one question at a time. Allow the person ample time to answer questions. Repeat the question again if the person does not respond.  Reduce nighttime restlessness.  Provide a comfortable bed.  Have a consistent nighttime routine.  Ensure a regular walking or physical activity schedule. Involve the person in daily activities as much as possible.  Limit napping during the day.  Limit caffeine.  Attend social events that stimulate rather than overwhelm the senses.  Encourage good nutrition and hydration.  Reduce distractions during meal times and snacks.  Avoid foods that are too hot or too cold.  Monitor chewing and swallowing ability.  Continue with routine vision, hearing, dental, and medical screenings.  Only give over-the-counter or prescription medicines as directed by the caregiver.  Monitor driving abilities. Do not allow the person to drive when safe driving is no longer possible.  Register with an identification program which could provide location assistance in the event of a missing person  situation. SEEK MEDICAL CARE IF:   New behavioral problems start such as moodiness, aggressiveness, or seeing things that are not there (hallucinations).  Any new problem with brain function happens. This includes problems with balance, speech, or falling a lot.  Problems with swallowing develop.  Any symptoms of other illness happen.  Small changes or worsening in any aspect of brain function can be a sign that the illness is getting worse. It can also be a sign of another medical illness such as infection. Seeing a caregiver right away is important. SEEK IMMEDIATE MEDICAL CARE IF:   A fever develops.  New or worsened confusion develops.  New or worsened sleepiness develops.  Staying awake becomes hard to do. Document Released: 09/21/2000 Document Revised: 06/20/2011 Document Reviewed: 08/23/2010 [facility] Patient Information 2013 [place], [place]. Marland Kitchen      Lorretta Harp

## 2012-02-16 DIAGNOSIS — R413 Other amnesia: Secondary | ICD-10-CM | POA: Insufficient documentation

## 2012-02-16 LAB — PTH, INTACT AND CALCIUM: PTH: 70 pg/mL (ref 14.0–72.0)

## 2012-02-24 ENCOUNTER — Other Ambulatory Visit: Payer: Self-pay | Admitting: General Practice

## 2012-02-24 ENCOUNTER — Telehealth: Payer: Self-pay | Admitting: Internal Medicine

## 2012-02-24 DIAGNOSIS — E559 Vitamin D deficiency, unspecified: Secondary | ICD-10-CM

## 2012-02-24 MED ORDER — CALCITRIOL 0.25 MCG PO CAPS: ORAL_CAPSULE | ORAL | Status: DC

## 2012-02-24 NOTE — Telephone Encounter (Signed)
Pt called and said that she was returning a call back from a nurse. Pls call.

## 2012-02-24 NOTE — Telephone Encounter (Signed)
Tried calling the pt back about her labs.  Was unable to reach her.   Left message on home number.  Will continue to call her until I reach her about lab results.

## 2012-07-11 ENCOUNTER — Other Ambulatory Visit: Payer: Self-pay | Admitting: Internal Medicine

## 2012-08-22 ENCOUNTER — Other Ambulatory Visit: Payer: Self-pay | Admitting: Internal Medicine

## 2012-09-28 ENCOUNTER — Encounter: Payer: Self-pay | Admitting: Internal Medicine

## 2012-09-28 ENCOUNTER — Ambulatory Visit (INDEPENDENT_AMBULATORY_CARE_PROVIDER_SITE_OTHER): Payer: Medicare Other | Admitting: Internal Medicine

## 2012-09-28 VITALS — BP 120/64 | HR 71 | Temp 99.2°F | Wt 113.0 lb

## 2012-09-28 DIAGNOSIS — N2581 Secondary hyperparathyroidism of renal origin: Secondary | ICD-10-CM

## 2012-09-28 DIAGNOSIS — R413 Other amnesia: Secondary | ICD-10-CM

## 2012-09-28 DIAGNOSIS — E559 Vitamin D deficiency, unspecified: Secondary | ICD-10-CM

## 2012-09-28 DIAGNOSIS — E039 Hypothyroidism, unspecified: Secondary | ICD-10-CM

## 2012-09-28 DIAGNOSIS — H9190 Unspecified hearing loss, unspecified ear: Secondary | ICD-10-CM

## 2012-09-28 LAB — BASIC METABOLIC PANEL
BUN: 15 mg/dL (ref 6–23)
Chloride: 104 mEq/L (ref 96–112)
Creatinine, Ser: 0.7 mg/dL (ref 0.4–1.2)
GFR: 87.02 mL/min (ref >60.00)
Glucose, Bld: 88 mg/dL (ref 70–99)

## 2012-09-28 LAB — TSH: TSH: 6 u[IU]/mL — ABNORMAL HIGH (ref 0.35–5.50)

## 2012-09-28 NOTE — Patient Instructions (Signed)
Will let you know of your lab results.  If they are normal then we can see you   Next January ( weather permitting )   Get a flu vaccine here or locally when they are available in the fall .

## 2012-09-28 NOTE — Progress Notes (Signed)
Chief Complaint  Patient presents with  . Follow-up  . Hypothyroidism    HPI: Patient comes in today for follow up of  multiple medical problems.  Pt is hard of hearing  But time taken ans believe she understands today.  Home will be bought   By city [place] and applying ot friends home  No set date of theses transactions.  No change in health    still very hard of hearing   Taking thyroid and calcitrol as in past .  No fallling  Uses cane when out and many times   No change in hearing or vision.   Live alone with  Attendant   Near  which helps  Drives but not far  To grocer store and ppts .  If cant remembber calls some one for help.  No scaring memory issues at thist time . Denies financial difficulties,  ROS: See pertinent positives and negatives per HPI. No cp sob  Bruising bleeding    Gi changes  Past Medical History  Diagnosis Date  . Hypothyroidism   . Osteoporosis   . Urinary urgency   . Urinary incontinence   . Hearing loss     wears hearing aids  . Secondary hyperparathyroidism   . Fall 08/29/2010    History reviewed. No pertinent family history.  History   Social History  . Marital Status: Widowed    Spouse Name: N/A    Number of Children: N/A  . Years of Education: N/A   Social History Main Topics  . Smoking status: Never Smoker   . Smokeless tobacco: None  . Alcohol Use: None  . Drug Use: None  . Sexually Active: None   Other Topics Concern  . None   Social History Narrative   Widowed   Child died age 18 years   Plays bridge without problem   3 meals a day prepared for her   M[facility] Encounter Prescriptions as of 09/28/2012  Medication Sig Dispense Refill  . calcitRIOL (ROCALTROL) 0.25 MCG capsule TAKE ONE CAPSULE DAILY  30 capsule  1  . Cyanocobalamin (VITAMIN B 12 PO) Take by mouth.        . levothyroxine (SYNTHROID, LEVOTHROID) 50 MCG tablet TAKE ONE TABLET (50 MCG TOTAL) BY MOUTH  DAILY.  90 tablet  0   No facility-administered encounter medications on file as of 09/28/2012.    EXAM:  BP 120/64  Pulse 71  Temp(Src) 99.2 F (37.3 C) (Oral)  Wt 113 lb (51.256 kg)  BMI 18.25 kg/m2  SpO2 97%  Body mass index is 18.25 kg/(m^2).  GENERAL: vitals reviewed and listed above, alert, oriented, appears well hydrated and in no acute distress glasses  Independent gait with cane some use .   Ambulates pretty well for age. HEENT: atraumatic, conjunctiva  clear, no obvious abnormalities on inspection of external nose and ears   Wearing glasses   NECK: no obvious masses on inspection palpation  No adenopathy or bruit  LUNGS: clear to auscultation bilaterally, no wheezes, rales or rhonchi, good air movement CV: HRRR, no clubbing cyanosis or  peripheral edema nl cap refill  Good le pulses  MS: moves all extremities without noticeable focal  Abnormality OA changes   No acute  Swelling  PSYCH: pleasant and cooperative, no obvious depression or anxiety  Not oriented ned to date   September 1966" I guess i got that wrong" Place ok to person .  Able to give hx about   Her  future move for moving  And dob .   ASSESSMENT AND PLAN:  Discussed the following assessment and plan:  HYPOTHYROIDISM - Plan: Basic metabolic panel, TSH  VITAMIN D DEFICIENCY - Plan: Basic metabolic panel, TSH, Vitamin D 25 hydroxy  SECONDARY HYPERPARATHYROIDISM - Plan: Basic metabolic panel, TSH, Vitamin D 25 hydroxy  Memory problem  Hearing loss Due for labs in the fall but  Do them now and if ok then see her after first of year or as needed .  Fall prevention Will let us know if more concerns about memory  Has close by attendant help per patient  Since she seems stable  Less is best   interventions at this time  -Patient advised to return or notify health care team  if symptoms worsen or persist or new concerns arise.  Patient Instructions  Will let you know of your lab results.  If they are normal  then we can see you   Next January ( weather permitting )   Get a flu vaccine here or locally when they are available in the fall .  Neta Mends. Preet Perrier M.D.  Total visit > 50% spent counseling and coordinating care

## 2012-10-18 NOTE — Progress Notes (Signed)
Quick Note:  I tried to reach pt by phone, no answer. I did leave a voice message with results and put a copy in mail to pt. ______

## 2013-03-28 ENCOUNTER — Other Ambulatory Visit: Payer: Self-pay | Admitting: Internal Medicine

## 2013-04-11 HISTORY — PX: PERMANENT PACEMAKER INSERTION: SHX6023

## 2013-04-16 ENCOUNTER — Telehealth: Payer: Self-pay | Admitting: Internal Medicine

## 2013-04-16 NOTE — Telephone Encounter (Signed)
Pt called regarding "headache", RN attempted to call back for triage, line was busy x 2.

## 2013-04-18 ENCOUNTER — Emergency Department (HOSPITAL_COMMUNITY): Payer: Medicare Other

## 2013-04-18 ENCOUNTER — Encounter: Payer: Self-pay | Admitting: Internal Medicine

## 2013-04-18 ENCOUNTER — Ambulatory Visit (INDEPENDENT_AMBULATORY_CARE_PROVIDER_SITE_OTHER): Payer: Medicare Other | Admitting: Internal Medicine

## 2013-04-18 ENCOUNTER — Inpatient Hospital Stay (HOSPITAL_COMMUNITY)
Admission: EM | Admit: 2013-04-18 | Discharge: 2013-04-22 | DRG: 243 | Disposition: A | Discharge status: Skilled Nursing Facility | Payer: Medicare Other | Attending: Internal Medicine | Admitting: Internal Medicine

## 2013-04-18 ENCOUNTER — Encounter (HOSPITAL_COMMUNITY): Payer: Self-pay | Admitting: Emergency Medicine

## 2013-04-18 VITALS — BP 144/60 | HR 40 | Temp 97.8°F | Wt 112.0 lb

## 2013-04-18 DIAGNOSIS — J3489 Other specified disorders of nose and nasal sinuses: Secondary | ICD-10-CM

## 2013-04-18 DIAGNOSIS — I498 Other specified cardiac arrhythmias: Secondary | ICD-10-CM

## 2013-04-18 DIAGNOSIS — I10 Essential (primary) hypertension: Secondary | ICD-10-CM

## 2013-04-18 DIAGNOSIS — I441 Atrioventricular block, second degree: Principal | ICD-10-CM | POA: Diagnosis present

## 2013-04-18 DIAGNOSIS — F0390 Unspecified dementia without behavioral disturbance: Secondary | ICD-10-CM | POA: Diagnosis present

## 2013-04-18 DIAGNOSIS — R6889 Other general symptoms and signs: Secondary | ICD-10-CM

## 2013-04-18 DIAGNOSIS — R0989 Other specified symptoms and signs involving the circulatory and respiratory systems: Secondary | ICD-10-CM

## 2013-04-18 DIAGNOSIS — J9383 Other pneumothorax: Secondary | ICD-10-CM | POA: Diagnosis not present

## 2013-04-18 DIAGNOSIS — R69 Illness, unspecified: Secondary | ICD-10-CM

## 2013-04-18 DIAGNOSIS — R001 Bradycardia, unspecified: Secondary | ICD-10-CM | POA: Insufficient documentation

## 2013-04-18 DIAGNOSIS — M810 Age-related osteoporosis without current pathological fracture: Secondary | ICD-10-CM | POA: Diagnosis present

## 2013-04-18 DIAGNOSIS — H9190 Unspecified hearing loss, unspecified ear: Secondary | ICD-10-CM | POA: Diagnosis present

## 2013-04-18 DIAGNOSIS — E039 Hypothyroidism, unspecified: Secondary | ICD-10-CM | POA: Diagnosis present

## 2013-04-18 DIAGNOSIS — E876 Hypokalemia: Secondary | ICD-10-CM

## 2013-04-18 HISTORY — DX: Presence of cardiac pacemaker: Z95.0

## 2013-04-18 LAB — BASIC METABOLIC PANEL
BUN: 20 mg/dL (ref 6–23)
CO2: 26 meq/L (ref 19–32)
Calcium: 8.8 mg/dL (ref 8.4–10.5)
Chloride: 105 mEq/L (ref 96–112)
Creatinine, Ser: 0.78 mg/dL (ref 0.50–1.10)
GFR calc Af Amer: 80 mL/min — ABNORMAL LOW (ref >90)
GFR calc non Af Amer: 69 mL/min — ABNORMAL LOW (ref >90)
GLUCOSE: 77 mg/dL (ref 70–99)
Potassium: 3.4 mEq/L — ABNORMAL LOW (ref 3.7–5.3)
Sodium: 143 mEq/L (ref 137–147)

## 2013-04-18 LAB — CBC
HCT: 37.7 % (ref 36.0–46.0)
HEMOGLOBIN: 12.6 g/dL (ref 12.0–15.0)
MCH: 31.4 pg (ref 26.0–34.0)
MCHC: 33.4 g/dL (ref 30.0–36.0)
MCV: 94 fL (ref 78.0–100.0)
Platelets: 238 10*3/uL (ref 150–400)
RBC: 4.01 MIL/uL (ref 3.87–5.11)
RDW: 13.5 % (ref 11.5–15.5)
WBC: 9.6 10*3/uL (ref 4.0–10.5)

## 2013-04-18 LAB — POCT I-STAT TROPONIN I: TROPONIN I, POC: 0.02 ng/mL (ref 0.00–0.08)

## 2013-04-18 LAB — MRSA PCR SCREENING: MRSA by PCR: NEGATIVE

## 2013-04-18 LAB — TSH: TSH: 18.667 u[IU]/mL — ABNORMAL HIGH (ref 0.350–4.500)

## 2013-04-18 MED ORDER — SODIUM CHLORIDE 0.9 % IJ SOLN
3.0000 mL | Freq: Two times a day (BID) | INTRAMUSCULAR | Status: DC
  Administered 2013-04-19: 3 mL via INTRAVENOUS

## 2013-04-18 MED ORDER — ONDANSETRON HCL 4 MG PO TABS: 4.0000 mg | ORAL_TABLET | Freq: Four times a day (QID) | ORAL | Status: DC | PRN

## 2013-04-18 MED ORDER — BENAZEPRIL HCL 5 MG PO TABS
5.0000 mg | ORAL_TABLET | Freq: Every day | ORAL | Status: DC
  Administered 2013-04-19 – 2013-04-22 (×4): 5 mg via ORAL
  Filled 2013-04-18 (×5): qty 1

## 2013-04-18 MED ORDER — ONDANSETRON HCL 4 MG/2ML IJ SOLN: 4.0000 mg | Freq: Four times a day (QID) | INTRAMUSCULAR | Status: DC | PRN

## 2013-04-18 MED ORDER — ACETAMINOPHEN 325 MG PO TABS: 650.0000 mg | ORAL_TABLET | Freq: Four times a day (QID) | ORAL | Status: DC | PRN

## 2013-04-18 MED ORDER — POTASSIUM CHLORIDE CRYS ER 20 MEQ PO TBCR: 40.0000 meq | EXTENDED_RELEASE_TABLET | Freq: Once | ORAL | Status: DC

## 2013-04-18 MED ORDER — LEVOTHYROXINE SODIUM 50 MCG PO TABS
50.0000 ug | ORAL_TABLET | Freq: Every day | ORAL | Status: DC
  Administered 2013-04-19 – 2013-04-22 (×4): 50 ug via ORAL
  Filled 2013-04-18 (×6): qty 1

## 2013-04-18 MED ORDER — HYDROCHLOROTHIAZIDE 12.5 MG PO CAPS
12.5000 mg | ORAL_CAPSULE | Freq: Every day | ORAL | Status: DC
  Administered 2013-04-19 – 2013-04-22 (×4): 12.5 mg via ORAL
  Filled 2013-04-18 (×5): qty 1

## 2013-04-18 MED ORDER — ACETAMINOPHEN 650 MG RE SUPP: 650.0000 mg | Freq: Four times a day (QID) | RECTAL | Status: DC | PRN

## 2013-04-18 MED ORDER — POTASSIUM CHLORIDE 20 MEQ/15ML (10%) PO LIQD
40.0000 meq | Freq: Once | ORAL | Status: AC
  Administered 2013-04-18: 40 meq via ORAL
  Filled 2013-04-18: qty 30

## 2013-04-18 NOTE — ED Notes (Signed)
Iantha Fallen- [phone] please call when needed.

## 2013-04-18 NOTE — H&P (Signed)
Reason for Consult: Bradycardia/ 2nd degree HB. Referring Physician: [facility]   HPI: The patient is a 80 y/o female with a history of hypothyroidism, followed by Dr. Regis Bill (PCP). She was sent to the [facility] ER by Dr. Regis Bill today after an office EKG revealed second degree heart block. The patient tells me that she has been asymptomatic. She states that the office visit today was for a routine check-up, although Dr. Velora Mediate note states that she presented with a chief complaint of "not feeling right". During interview in ER, she denied any dizziness, lightheadedness, syncope/ near syncope, chest pain, SOB and palpitations. She denies any prior cardiac history. She states that she is hungry and ready to go home.   Past Medical History  Diagnosis Date  . Hypothyroidism   . Osteoporosis   . Urinary urgency   . Urinary incontinence   . Hearing loss     wears hearing aids  . Secondary hyperparathyroidism   . Fall 08/29/2010    Past Surgical History  Procedure Laterality Date  . Removal of carotid tumor  1997    left sx nonmalignant    No family history on file.  Social History:  reports that she has never smoked. She does not have any smokeless tobacco history on file. Her alcohol and drug histories are not on file.  Allergies: No Known Allergies  Medications: Prior to Admission medications   Medication Sig Start Date End Date Taking? Authorizing Provider  levothyroxine (SYNTHROID, LEVOTHROID) 50 MCG tablet Take 50 mcg by mouth daily before breakfast.   Yes Historical Provider, MD     Results for orders placed during the hospital encounter of 04/18/13 (from the past 48 hour(s))  CBC     Status: None   Collection Time    04/18/13 12:28 PM      Result Value Range   WBC 9.6  4.0 - 10.5 K/uL   RBC 4.01  3.87 - 5.11 MIL/uL   Hemoglobin 12.6  12.0 - 15.0 g/dL   HCT 37.7  36.0 - 46.0 %   MCV 94.0  78.0 - 100.0 fL   MCH 31.4  26.0 - 34.0 pg   MCHC 33.4  30.0 - 36.0 g/dL   RDW  13.5  11.5 - 15.5 %   Platelets 238  150 - 400 K/uL  BASIC METABOLIC PANEL     Status: Abnormal   Collection Time    04/18/13 12:28 PM      Result Value Range   Sodium 143  137 - 147 mEq/L   Potassium 3.4 (*) 3.7 - 5.3 mEq/L   Chloride 105  96 - 112 mEq/L   CO2 26  19 - 32 mEq/L   Glucose, Bld 77  70 - 99 mg/dL   BUN 20  6 - 23 mg/dL   Creatinine, Ser 0.78  0.50 - 1.10 mg/dL   Calcium 8.8  8.4 - 10.5 mg/dL   GFR calc non Af Amer 69 (*) >90 mL/min   GFR calc Af Amer 80 (*) >90 mL/min   Comment: (NOTE)     The eGFR has been calculated using the CKD EPI equation.     This calculation has not been validated in all clinical situations.     eGFR's persistently <90 mL/min signify possible Chronic Kidney     Disease.  POCT I-STAT TROPONIN I     Status: None   Collection Time    04/18/13 12:52 PM      Result Value  Range   Troponin i, poc 0.02  0.00 - 0.08 ng/mL   Comment 3            Comment: Due to the release kinetics of cTnI,     a negative result within the first hours     of the onset of symptoms does not rule out     myocardial infarction with certainty.     If myocardial infarction is still suspected,     repeat the test at appropriate intervals.    Dg Chest Portable 1 View  04/18/2013   CLINICAL DATA:  Irregular heartbeat  EXAM: PORTABLE CHEST - 1 VIEW  COMPARISON:  Portable exam 1250 hr without priors for comparison  FINDINGS: Patient's hand projects over inferior thorax.  Borderline enlargement of cardiac silhouette.  Atherosclerotic calcification aorta.  Mediastinal contours and pulmonary vascularity normal.  Lungs minimally hyperinflated but clear.  No pleural effusion or pneumothorax.  Bones diffusely demineralized.  Numerous EKG leads project over chest.  IMPRESSION: Borderline enlargement of cardiac silhouette.  No acute abnormalities.   Electronically Signed   By: Lavonia Dana M.D.   On: 04/18/2013 13:13    Review of Systems  Respiratory: Negative for shortness of breath.    Cardiovascular: Negative for chest pain, palpitations and leg swelling.  Neurological: Negative for dizziness and loss of consciousness.  All other systems reviewed and are negative.   Blood pressure 161/67, pulse 60, temperature 97.8 F (36.6 C), temperature source Oral, resp. rate 15, SpO2 100.00%. Physical Exam  Constitutional: She is oriented to person, place, and time. She appears well-developed and well-nourished. No distress.  Neck: Carotid bruit is not present.  Cardiovascular: Normal rate and regular rhythm.  Exam reveals no gallop and no friction rub.   No murmur heard. Pulses:      Radial pulses are 2+ on the right side, and 2+ on the left side.       Dorsalis pedis pulses are 2+ on the right side, and 2+ on the left side.  Respiratory: Effort normal and breath sounds normal. No respiratory distress. She has no wheezes. She has no rales.  Musculoskeletal: She exhibits no edema.  Neurological: She is alert and oriented to person, place, and time.  Skin: Skin is warm and dry. She is not diaphoretic.  Psychiatric: She has a normal mood and affect. Her behavior is normal.    Assessment/Plan: Principal Problem:   Mobitz type 2 second degree heart block Active Problems:   HYPOTHYROIDISM  Plan: 80 y/o female with no prior cardiac history, sent directly from PCP office to ER for evaluation of Type II second degree heart block. EKG in ER confirms second degree block. Pt is asymptomatic. HR in the 60s. BP elevated w/ SBP in the 170s.  She is not on any AV nodal blocking agents. Pace pads are in place. She is hypokalemic with K of 3.4. Will replete. Will admit to telemetry and will monitor overnight. Will start HCTZ for hypertension. If ok home tomorrow.  Consuelo Pandy, PA-C 04/18/2013, 3:47 PM

## 2013-04-18 NOTE — ED Notes (Signed)
MD at bedside. 

## 2013-04-18 NOTE — Progress Notes (Signed)
Pre visit review using our clinic review tool, if applicable. No additional management support is needed unless otherwise documented below in the visit note.  Chief Complaint  Patient presents with  . not right    HPI: Patient comes in today for SDA for  new problem evaluation. Make appointment because she just doesn't feel right "something wrong in the head area" but not really a head pain although she describes it could be a headache. Denies any dizziness or falling but uses a cane or a stick when she walks. Runny nose for several days but not cold no fever and chills No chest pain shortness of breath  Some one drove her to office  Denies any change in her hearing vision is hard of hearing doesn't think her memory is any worse "for someone  80" ROS: See pertinent positives and negatives per HPI. No GI GU symptoms of new hearing vision changes is hard of hearing wears glasses. No change in bowel habits blood in the stool. Only medicine taking is her Synthroid. Last TSH was about 6 months ago is not on a beta blocker No new medications.  Past Medical History  Diagnosis Date  . Hypothyroidism   . Osteoporosis   . Urinary urgency   . Urinary incontinence   . Hearing loss     wears hearing aids  . Secondary hyperparathyroidism   . Fall 08/29/2010    No family history on file.  History   Social History  . Marital Status: Widowed    Spouse Name: N/A    Number of Children: N/A  . Years of Education: N/A   Social History Main Topics  . Smoking status: Never Smoker   . Smokeless tobacco: None  . Alcohol Use: None  . Drug Use: None  . Sexual Activity: None   Other Topics Concern  . None   Social History Narrative   Widowed   Child died age 69 years   35 [address] without problem   3 meals a day prepared for her   Moved to [facility]     Outpatient Encounter Prescriptions as of 04/18/2013  Medication Sig  . calcitRIOL (ROCALTROL) 0.25 MCG capsule TAKE ONE CAPSULE  DAILY  . Cyanocobalamin (VITAMIN B 12 PO) Take by mouth.    . levothyroxine (SYNTHROID, LEVOTHROID) 50 MCG tablet TAKE ONE TABLET (50 MCG TOTAL) BY MOUTH DAILY.    EXAM:  BP 144/60  Pulse 40  Temp(Src) 97.8 F (36.6 C) (Oral)  Wt 112 lb (50.803 kg)  Body mass index is 18.09 kg/(m^2).  GENERAL: vitals reviewed and listed above, alert, oriented, appears well hydrated and in no acute distress ambulatory with cane. Hard of hearing.  although slow. No obvious tremor no edema HEENT: atraumatic, conjunctiva  clear, no obvious abnormalities on inspection of external nose has some clear discharge no face pain EOMs are full follows direction TMs intact OP : no lesion edema or exudate minimal congestion face nontender NECK: no obvious masses on inspection palpation no bruits are heard LUNGS: clear to auscultation bilaterally, no wheezes, rales or rhonchi,  CV: HRRR very slow rhythm counted 40 over 60 secondes  upright or lying down, no clubbing cyanosis or  peripheral edema nl cap refill  MS: moves all extremities without noticeable focal  abnormality has osteoarthritic changes. PSYCH: pleasant and cooperative, no obvious depression or anxiety she is hard of hearing memory not assessed but has a normal conversation although delayed because of her hearing. Neurologically she is ambulatory  slow with a cane EKG bradycardia with 2-1 av block  ASSESSMENT AND PLAN:  Discussed the following assessment and plan:  Second degree AV block - Plan: CANCELED: Ambulatory referral to Cardiology  Bradycardia 38 - Plan: EKG 12-Lead, CANCELED: Ambulatory referral to Cardiology  Runny nose  Feeling unwell - Most likely related to her bradycardia poor perfusion although she looks fairly well and stable considering her heart rate. Reviewed findings possible intervent   Discussed with Dr. Aundra Dubin who reviewed the EKG. Also discussed with patient and companion. To go to [facility] cut emergency room for evaluation  and treatment.  Disc measures to decrease  Exposure to flu at the ed . Mask given.  -Patient advised to return or notify health care team  if symptoms worsen or persist or new concerns arise.  Patient Instructions  You have a very slow pulse rate with some heart block. The cardiologist advised we have you be seen in the emergency room to help intervention with heart block. People don't feel well whether pulses low because they don't get blood flow to the brain. And other vital organs and can be very dangerous without treatment. Go to the emergency room as his code we will call ahead about the situation   Cardiology can evaluate at the hospital.   Standley Brooking. Panosh M.D.  Total visit 40 mins > 50% spent counseling and coordinating care

## 2013-04-18 NOTE — ED Notes (Signed)
Notified [facility]diology of PTs rhythm and rate change, step down bed ordered, no further orders at this time. Pt is able to eat. Pt remains asymptomatic

## 2013-04-18 NOTE — Patient Instructions (Signed)
You have a very slow pulse rate with some heart block. The cardiologist advised we have you be seen in the emergency room to help intervention with heart block. People don't feel well whether pulses low because they don't get blood flow to the brain. And other vital organs and can be very dangerous without treatment. Go to the emergency room as his code we will call ahead about the situation   Cardiology can evaluate at the hospital.

## 2013-04-18 NOTE — Consult Note (Signed)
Reason for Consult: [facility]B Referring Physician: [facility]   HPI: The patient is a 80 y/o female with a history of hypothyroidism, followed by Dr. Regis Bill (PCP). She was sent to the [facility] ER by Dr. Regis Bill today after an office EKG revealed second degree heart block. The patient tells me that she has been asymptomatic. She states that the office visit today was for a routine check-up, although Dr. Velora Mediate note states that she presented with a chief complaint of "not feeling right". During interview in ER, she denied any dizziness, lightheadedness, syncope/ near syncope, chest pain, SOB and palpitations. She denies any prior cardiac history. She states that she is hungry and ready to go home.   Past Medical History  Diagnosis Date  . Hypothyroidism   . Osteoporosis   . Urinary urgency   . Urinary incontinence   . Hearing loss     wears hearing aids  . Secondary hyperparathyroidism   . Fall 08/29/2010    Past Surgical History  Procedure Laterality Date  . Removal of carotid tumor  1997    left sx nonmalignant    No family history on file.  Social History:  reports that she has never smoked. She does not have any smokeless tobacco history on file. Her alcohol and drug histories are not on file.  Allergies: No Known Allergies  Medications: Prior to Admission medications   Medication Sig Start Date End Date Taking? Authorizing Provider  levothyroxine (SYNTHROID, LEVOTHROID) 50 MCG tablet Take 50 mcg by mouth daily before breakfast.   Yes Historical Provider, MD     Results for orders placed during the hospital encounter of 04/18/13 (from the past 48 hour(s))  CBC     Status: None   Collection Time    04/18/13 12:28 PM      Result Value Range   WBC 9.6  4.0 - 10.5 K/uL   RBC 4.01  3.87 - 5.11 MIL/uL   Hemoglobin 12.6  12.0 - 15.0 g/dL   HCT 37.7  36.0 - 46.0 %   MCV 94.0  78.0 - 100.0 fL   MCH 31.4  26.0 - 34.0 pg   MCHC 33.4  30.0 - 36.0 g/dL   RDW 13.5  11.5 - 15.5 %   Platelets 238  150 - 400 K/uL  BASIC METABOLIC PANEL     Status: Abnormal   Collection Time    04/18/13 12:28 PM      Result Value Range   Sodium 143  137 - 147 mEq/L   Potassium 3.4 (*) 3.7 - 5.3 mEq/L   Chloride 105  96 - 112 mEq/L   CO2 26  19 - 32 mEq/L   Glucose, Bld 77  70 - 99 mg/dL   BUN 20  6 - 23 mg/dL   Creatinine, Ser 0.78  0.50 - 1.10 mg/dL   Calcium 8.8  8.4 - 10.5 mg/dL   GFR calc non Af Amer 69 (*) >90 mL/min   GFR calc Af Amer 80 (*) >90 mL/min   Comment: (NOTE)     The eGFR has been calculated using the CKD EPI equation.     This calculation has not been validated in all clinical situations.     eGFR's persistently <90 mL/min signify possible Chronic Kidney     Disease.  POCT I-STAT TROPONIN I     Status: None   Collection Time    04/18/13 12:52 PM      Result Value Range   Troponin  i, poc 0.02  0.00 - 0.08 ng/mL   Comment 3            Comment: Due to the release kinetics of cTnI,     a negative result within the first hours     of the onset of symptoms does not rule out     myocardial infarction with certainty.     If myocardial infarction is still suspected,     repeat the test at appropriate intervals.    Dg Chest Portable 1 View  04/18/2013   CLINICAL DATA:  Irregular heartbeat  EXAM: PORTABLE CHEST - 1 VIEW  COMPARISON:  Portable exam 1250 hr without priors for comparison  FINDINGS: Patient's hand projects over inferior thorax.  Borderline enlargement of cardiac silhouette.  Atherosclerotic calcification aorta.  Mediastinal contours and pulmonary vascularity normal.  Lungs minimally hyperinflated but clear.  No pleural effusion or pneumothorax.  Bones diffusely demineralized.  Numerous EKG leads project over chest.  IMPRESSION: Borderline enlargement of cardiac silhouette.  No acute abnormalities.   Electronically Signed   By: Lavonia Dana M.D.   On: 04/18/2013 13:13    Review of Systems  Respiratory: Negative for shortness of breath.   Cardiovascular:  Negative for chest pain, palpitations and leg swelling.  Neurological: Negative for dizziness and loss of consciousness.  All other systems reviewed and are negative.   Blood pressure 173/86, pulse 72, temperature 97.8 F (36.6 C), temperature source Oral, resp. rate 18, SpO2 99.00%. Physical Exam  Constitutional: She is oriented to person, place, and time. She appears well-developed and well-nourished. No distress.  Neck: Carotid bruit is not present.  Cardiovascular: Normal rate and regular rhythm.  Exam reveals no gallop and no friction rub.   No murmur heard. Pulses:      Radial pulses are 2+ on the right side, and 2+ on the left side.       Dorsalis pedis pulses are 2+ on the right side, and 2+ on the left side.  Respiratory: Effort normal and breath sounds normal. No respiratory distress. She has no wheezes. She has no rales.  Musculoskeletal: She exhibits no edema.  Neurological: She is alert and oriented to person, place, and time.  Skin: Skin is warm and dry. She is not diaphoretic.  Psychiatric: She has a normal mood and affect. Her behavior is normal.    Assessment/Plan: Principal Problem:   AV block, 2nd degree presumed Mobitz with 2:1 conduction Active Problems:   HYPOTHYROIDISM   Hypertension   Hypokalemia  Plan: 80 y/o female with no prior cardiac history, sent directly from PCP office to ER for evaluation of Type II second degree heart block. EKG in ER confirms second degree block with 2:1 conduction. Pt is asymptomatic. HR in the 60s. BP stable.  She is not on any AV nodal blocking agents. ER RN ordered to place pacer pads on. She is hypokalemic with K of 3.4. Will replete. ? Checking Mg. Dr. Caryl Comes to follow. Keep NPO until seen by MD.   Vanessa Pandy, PA-C 04/18/2013, 4:45 PM    This 80 year old lady presented to her PCP with nonspecific complaints for which she has no recollection and was found to be in 2-1 heart block. The QRS duration of her intrinsic  rhythm and a heart block rhythm of about 110 ms and a similar morphology suggesting an AV nodal origin. This would be more consistent with a mechanism-type I and as such in the absence of symptoms there is no  indication for pacing.  We will observe her overnight to make sure there is no more serious conduction system disease. We'll begin her on low-dose ACE inhibitor therapy which will help with her hypertension as well as hyperkalemia.

## 2013-04-18 NOTE — ED Notes (Signed)
Pt became bradycardic, RN at bedside, Dr. Caryl Comes paged, EKG completed, PAcer pads on pt. Pt is asymptomatic, speaking with RN, denies pain, denies dizziness,  bp 145/61

## 2013-04-18 NOTE — ED Notes (Signed)
Patient denies pain and is resting comfortably.  

## 2013-04-18 NOTE — ED Notes (Signed)
Pt taken back to Trauma B via wheelchair; pt undressed, in gown, on monitor, continuous pulse oximetry and blood pressure cuff

## 2013-04-18 NOTE — ED Notes (Signed)
Ordered Heart Healthy tray

## 2013-04-18 NOTE — ED Notes (Signed)
Pt sent by MD's office for complete heart block. Pt alert and oriented x 4. HR in 60's on arrival. Does not appear to be in heart block. Denies pain or any symptoms.

## 2013-04-18 NOTE — ED Notes (Signed)
Call Sister Artelia Laroche at [phone]  For any needs.

## 2013-04-18 NOTE — ED Provider Notes (Signed)
CSN: [id number]     Arrival date & time 04/18/13  1217 History   First MD Initiated Contact with Patient 04/18/13 1227     Chief Complaint  Patient presents with  . Irregular Heart Beat   (Consider location/radiation/quality/duration/timing/severity/associated sxs/prior Treatment) HPI 80 year old female with a history of hypothyroidism, osteoporosis, urinary incontinence, who presents today as a transfer from her family medicine office for concern for heart block.  Upon arrival the patient states that she feels fine and states that she does not want to be here. Recently, per the outside physicians records, the patient has been having mild fatigue lately. Otherwise the patient has had no significant symptoms. Patient has not complained about chest pain, nausea, vomiting, diaphoresis.  Past Medical History  Diagnosis Date  . Hypothyroidism   . Osteoporosis   . Urinary urgency   . Urinary incontinence   . Hearing loss     wears hearing aids  . Secondary hyperparathyroidism   . Fall 08/29/2010   Past Surgical History  Procedure Laterality Date  . Removal of carotid tumor  1997    left sx nonmalignant   No family history on file. History  Substance Use Topics  . Smoking status: Never Smoker   . Smokeless tobacco: Not on file  . Alcohol Use: Not on file   OB History   Grav Para Term Preterm Abortions TAB SAB Ect Mult Living                 Review of Systems  Constitutional: Negative for fever and chills.  HENT: Negative for sore throat.   Eyes: Negative for pain.  Respiratory: Negative for cough and shortness of breath.   Cardiovascular: Negative for chest pain.  Gastrointestinal: Negative for nausea, vomiting, abdominal pain and diarrhea.  Genitourinary: Negative for dysuria.  Musculoskeletal: Negative for back pain.  Skin: Negative for rash.  Neurological: Positive for headaches. Negative for numbness.    Allergies  Review of patient's allergies indicates no known  allergies.  Home Medications   Current Outpatient Rx  Name  Route  Sig  Dispense  Refill  . levothyroxine (SYNTHROID, LEVOTHROID) 50 MCG tablet   Oral   Take 50 mcg by mouth daily before breakfast.          BP 161/67  Pulse 60  Temp(Src) 97.8 F (36.6 C) (Oral)  Resp 15  SpO2 100% Physical Exam  Constitutional: She is oriented to person, place, and time. She appears well-developed and well-nourished. No distress.  HENT:  Head: Normocephalic and atraumatic.  Right Ear: Decreased hearing is noted.  Left Ear: Decreased hearing is noted.  Eyes: Pupils are equal, round, and reactive to light. Right eye exhibits no discharge. Left eye exhibits no discharge.  Neck: Normal range of motion.  Cardiovascular: Normal rate, regular rhythm and normal heart sounds.   Pulmonary/Chest: Effort normal and breath sounds normal.  Abdominal: Soft. She exhibits no distension. There is no tenderness.  Musculoskeletal: Normal range of motion.  Neurological: She is alert and oriented to person, place, and time.  Skin: Skin is warm. She is not diaphoretic.    ED Course  Procedures (including critical care time) Labs Review Labs Reviewed  BASIC METABOLIC PANEL - Abnormal; Notable for the following:    Potassium 3.4 (*)    GFR calc non Af Amer 69 (*)    GFR calc Af Amer 80 (*)    All other components within normal limits  CBC  TSH  POCT I-STAT TROPONIN I  Imaging Review Dg Chest Portable 1 View  04/18/2013   CLINICAL DATA:  Irregular heartbeat  EXAM: PORTABLE CHEST - 1 VIEW  COMPARISON:  Portable exam 1250 hr without priors for comparison  FINDINGS: Patient's hand projects over inferior thorax.  Borderline enlargement of cardiac silhouette.  Atherosclerotic calcification aorta.  Mediastinal contours and pulmonary vascularity normal.  Lungs minimally hyperinflated but clear.  No pleural effusion or pneumothorax.  Bones diffusely demineralized.  Numerous EKG leads project over chest.  IMPRESSION:  Borderline enlargement of cardiac silhouette.  No acute abnormalities.   Electronically Signed   By: Lavonia Dana M.D.   On: 04/18/2013 13:13    EKG Interpretation    Date/Time:  Thursday April 18 2013 12:29:32 EST Ventricular Rate:  65 PR Interval:  173 QRS Duration: 110 QT Interval:  428 QTC Calculation: 445 R Axis:   -85 Text Interpretation:  Sinus rhythm Ventricular premature complex Left anterior fascicular block Anterior infarct, old Prior with 2nd degree AV block Confirmed by DOCHERTY  MD, MEGAN [phone]) on 04/18/2013 12:41:19 PM            MDM   1. Second degree AV block    80 year old female with medical problems as described above who presents today for possible heart block.  Upon arrival an EKG was performed which demonstrates sinus rhythm with PVCs and old anterior infarct. The prior EKG which was performed this morning at 10:59 AM demonstrates a heart rate of 38, PR interval 326, QT 516, QTC 478, sinus bradycardia with type 2 second degree AV block.  Patient hemodynamically stable upon arrival. No acute complaints. We'll check some basic labs including a TSH basic blood count and a troponin. We'll also check a chest x-ray. Given the patient's EKG findings and her age and comorbidities, we'll consult cardiology for management of possible type 2 second degree heart block.  Cardiology to admit for continued workup/observation, possible pacer placement. Anticipate admission. Pacer pads placed on patient in the meantime. HDS throughout ED stay. Anticipate admission in stable condition. Patient seen and evaluated by myself and my attending, Dr. Tawnya Crook.       Freddi Che, MD 04/18/13 1626

## 2013-04-18 NOTE — ED Notes (Signed)
Saunders, call her when ready or if anything needed

## 2013-04-18 NOTE — Progress Notes (Signed)
Discussed bradycardia with Dr Caryl Comes. For now no further treatment as she is asymptomatic. Will admit her to step down. Pacer pads in place.   Kerin Ransom PA-C 04/18/2013 6:53 PM

## 2013-04-19 ENCOUNTER — Encounter (HOSPITAL_COMMUNITY): Payer: Self-pay | Admitting: General Practice

## 2013-04-19 ENCOUNTER — Encounter (HOSPITAL_COMMUNITY)
Admission: EM | Disposition: A | Discharge status: Skilled Nursing Facility | Payer: Self-pay | Source: Home / Self Care | Attending: Internal Medicine

## 2013-04-19 DIAGNOSIS — Z950 Presence of cardiac pacemaker: Secondary | ICD-10-CM

## 2013-04-19 DIAGNOSIS — I441 Atrioventricular block, second degree: Secondary | ICD-10-CM

## 2013-04-19 HISTORY — PX: PERMANENT PACEMAKER INSERTION: SHX5480

## 2013-04-19 HISTORY — DX: Presence of cardiac pacemaker: Z95.0

## 2013-04-19 HISTORY — PX: INSERT / REPLACE / REMOVE PACEMAKER: SUR710

## 2013-04-19 LAB — BASIC METABOLIC PANEL
BUN: 19 mg/dL (ref 6–23)
CO2: 26 meq/L (ref 19–32)
Calcium: 8.7 mg/dL (ref 8.4–10.5)
Chloride: 111 mEq/L (ref 96–112)
Creatinine, Ser: 0.79 mg/dL (ref 0.50–1.10)
GFR calc Af Amer: 79 mL/min — ABNORMAL LOW (ref >90)
GFR calc non Af Amer: 69 mL/min — ABNORMAL LOW (ref >90)
Glucose, Bld: 98 mg/dL (ref 70–99)
POTASSIUM: 4.1 meq/L (ref 3.7–5.3)
SODIUM: 147 meq/L (ref 137–147)

## 2013-04-19 LAB — CBC
HEMATOCRIT: 36.1 % (ref 36.0–46.0)
Hemoglobin: 12.2 g/dL (ref 12.0–15.0)
MCH: 31.7 pg (ref 26.0–34.0)
MCHC: 33.8 g/dL (ref 30.0–36.0)
MCV: 93.8 fL (ref 78.0–100.0)
Platelets: 223 10*3/uL (ref 150–400)
RBC: 3.85 MIL/uL — AB (ref 3.87–5.11)
RDW: 13.4 % (ref 11.5–15.5)
WBC: 9.9 10*3/uL (ref 4.0–10.5)

## 2013-04-19 SURGERY — PERMANENT PACEMAKER INSERTION
Anesthesia: LOCAL

## 2013-04-19 MED ORDER — ONDANSETRON HCL 4 MG/2ML IJ SOLN: 4.0000 mg | Freq: Four times a day (QID) | INTRAMUSCULAR | Status: DC | PRN

## 2013-04-19 MED ORDER — CHLORHEXIDINE GLUCONATE 4 % EX LIQD
60.0000 mL | Freq: Once | CUTANEOUS | Status: AC
  Administered 2013-04-19: 4 via TOPICAL

## 2013-04-19 MED ORDER — SODIUM CHLORIDE 0.9 % IV SOLN: 250.0000 mL | INTRAVENOUS | Status: DC | PRN

## 2013-04-19 MED ORDER — CHLORHEXIDINE GLUCONATE 4 % EX LIQD
60.0000 mL | Freq: Once | CUTANEOUS | Status: AC
  Filled 2013-04-19: qty 60

## 2013-04-19 MED ORDER — SODIUM CHLORIDE 0.9 % IV SOLN
INTRAVENOUS | Status: DC
  Administered 2013-04-19: 50 mL/h via INTRAVENOUS

## 2013-04-19 MED ORDER — SODIUM CHLORIDE 0.9 % IR SOLN
80.0000 mg | Status: DC
  Filled 2013-04-19: qty 2

## 2013-04-19 MED ORDER — CEFAZOLIN SODIUM 1-5 GM-% IV SOLN
1.0000 g | Freq: Three times a day (TID) | INTRAVENOUS | Status: AC
  Administered 2013-04-19 – 2013-04-20 (×3): 1 g via INTRAVENOUS
  Filled 2013-04-19 (×3): qty 50

## 2013-04-19 MED ORDER — LIDOCAINE HCL (PF) 1 % IJ SOLN
INTRAMUSCULAR | Status: AC
  Filled 2013-04-19: qty 60

## 2013-04-19 MED ORDER — SODIUM CHLORIDE 0.9 % IJ SOLN
3.0000 mL | Freq: Two times a day (BID) | INTRAMUSCULAR | Status: DC
  Administered 2013-04-19 – 2013-04-22 (×5): 3 mL via INTRAVENOUS

## 2013-04-19 MED ORDER — ACETAMINOPHEN 325 MG PO TABS: 325.0000 mg | ORAL_TABLET | ORAL | Status: DC | PRN

## 2013-04-19 MED ORDER — SODIUM CHLORIDE 0.9 % IJ SOLN: 3.0000 mL | INTRAMUSCULAR | Status: DC | PRN

## 2013-04-19 MED ORDER — HYDROCODONE-ACETAMINOPHEN 5-325 MG PO TABS
1.0000 | ORAL_TABLET | ORAL | Status: DC | PRN
  Filled 2013-04-19: qty 1

## 2013-04-19 MED ORDER — CEFAZOLIN SODIUM-DEXTROSE 2-3 GM-% IV SOLR
2.0000 g | INTRAVENOUS | Status: DC
  Filled 2013-04-19 (×2): qty 50

## 2013-04-19 NOTE — Op Note (Signed)
SURGEON:  Thompson Grayer, MD     PREPROCEDURE DIAGNOSIS:  Symptomatic mobitz II second degree AV block    POSTPROCEDURE DIAGNOSIS:  Symptomatic mobitz II second degree AV block     PROCEDURES:   1. Left upper extremity venography.       INTRODUCTION:  Vanessa Keller is a 80 y.o. female with a history of mobitz II second degree AV block who presents today for pacemaker implantation.  The patient reports intermittent episodes of dizziness over the past few days with fatigue.  No reversible causes have been identified.  The patient therefore presents today for pacemaker implantation.     DESCRIPTION OF PROCEDURE:  Informed written consent was obtained, and  the patient was brought to the electrophysiology lab in a fasting state.  The patient required no sedation for the procedure today.  The patients left chest was prepped and draped in the usual sterile fashion by the EP lab staff. The skin overlying the left deltopectoral region was infiltrated with lidocaine for local analgesia.  A 4-cm incision was made over the left deltopectoral region.  A left subcutaneous pacemaker pocket was fashioned using a combination of sharp and blunt dissection. Electrocautery was required to assure hemostasis.    RA/RV Lead Placement: The left axillary vein was cannulated.  Through the left axillary vein, a [employer] model [phone]  (serial number  [id number]) right atrial lead and a [facility] model [phone] (serial number  [id number]) right ventricular lead were advanced with fluoroscopic visualization into the right atrial appendage and right ventricular apex positions respectively.  Initial atrial lead P- waves measured 4.5 mV with impedance of 760 ohms and a threshold of <1 V at 0.5 msec.  Right ventricular lead R-waves measured 12 mV with an impedance of 604 ohms and a threshold of <0.5 V at 0.5 msec.  Both leads were secured to the pectoralis fascia using #2-0 silk over the suture sleeves.   Device Placement:  The leads were then connected to a Harvey DR  model [id number] (serial number  [id number] ) pacemaker.  The pocket was irrigated with copious gentamicin solution.  The pacemaker was then placed into the pocket.  The pocket was then closed in 2 layers with 2.0 Vicryl suture for the subcutaneous and subcuticular layers.  Steri-  Strips and a sterile dressing were then applied.  There were no early apparent complications.     CONCLUSIONS:   1. Successful implantation of a [employer] Medical Assurity DR  dual-chamber pacemaker for symptomatic mobitz II second degree AV block  2. No early apparent complications.           Thompson Grayer, MD 04/19/2013 12:02 PM

## 2013-04-19 NOTE — H&P (View-Only) (Signed)
   ELECTROPHYSIOLOGY ROUNDING NOTE    Patient Name: Vanessa Keller Date of Encounter: 04/19/2013    SUBJECTIVE:Patient confused last night - no specific complaints this morning  TSH - 18.667  TELEMETRY: Reviewed telemetry pt in sinus rhythm, intermittent Mobitz I, intermittent multiple non conducted P waves overnight (asymptomatic) Filed Vitals:   04/19/13 0300 04/19/13 0400 04/19/13 0500 04/19/13 0600  BP: 106/52 126/61 102/61 99/38  Pulse: 61 59 57 52  Temp:   98.7 F (37.1 C)   TempSrc:   Oral   Resp: 14 15 13 14   Height:      Weight:      SpO2:   99%     Intake/Output Summary (Last 24 hours) at 04/19/13 0708 Last data filed at 04/18/13 2300  Gross per 24 hour  Intake    240 ml  Output    100 ml  Net    140 ml    CURRENT MEDICATIONS: . benazepril  5 mg Oral Daily  . hydrochlorothiazide  12.5 mg Oral Daily  . levothyroxine  50 mcg Oral QAC breakfast  . sodium chloride  3 mL Intravenous Q12H    LABS: Basic Metabolic Panel:  Recent Labs  04/18/13 1228 04/19/13 0220  NA 143 147  K 3.4* 4.1  CL 105 111  CO2 26 26  GLUCOSE 77 98  BUN 20 19  CREATININE 0.78 0.79  CALCIUM 8.8 8.7   CBC:  Recent Labs  04/18/13 1228 04/19/13 0220  WBC 9.6 9.9  HGB 12.6 12.2  HCT 37.7 36.1  MCV 94.0 93.8  PLT 238 223   Thyroid Function Tests:  Recent Labs  04/18/13 1228  TSH 18.667*    Radiology/Studies:  Dg Chest Portable 1 View 04/18/2013   CLINICAL DATA:  Irregular heartbeat  EXAM: PORTABLE CHEST - 1 VIEW  COMPARISON:  Portable exam 1250 hr without priors for comparison  FINDINGS: Patient's hand projects over inferior thorax.  Borderline enlargement of cardiac silhouette.  Atherosclerotic calcification aorta.  Mediastinal contours and pulmonary vascularity normal.  Lungs minimally hyperinflated but clear.  No pleural effusion or pneumothorax.  Bones diffusely demineralized.  Numerous EKG leads project over chest.  IMPRESSION: Borderline enlargement of cardiac  silhouette.  No acute abnormalities.   Electronically Signed   By: Lavonia Dana M.D.   On: 04/18/2013 13:13   PHYSICAL EXAM Well developed and nourished in no acute distress HENT normal Neck supple with JVP-flat Clear Regular rate and rhythm, no murmurs or gallops Abd-soft with active BS No Clubbing cyanosis edema Skin-warm and dry A & Oriented  Grossly normal sensory and motor function   Principal Problem:   AV block, 2nd degree presumed Mobitz with 2:1 conduction Active Problems:   HYPOTHYROIDISM   Hypertension   Hypokalemia    Now with high grade heart block with multiple non conducted Pwave and ventricular escape beats Will proceed with pacing  Thyroid replacment indicated but time domain for restoration too long to avoid pacing

## 2013-04-19 NOTE — Care Management Note (Addendum)
    Page 1 of 1   04/22/2013     12:46:25 PM   CARE MANAGEMENT NOTE 04/22/2013  Patient:  SHARI, NATT   Account Number:  [id number]  Date Initiated:  04/19/2013  Documentation initiated by:  Elissa Hefty  Subjective/Objective Assessment:   adm w 2nd deg av block     Action/Plan:   lives alone, rents part of her house   Anticipated DC Date:     Anticipated DC Plan:           Choice offered to / List presented to:             Status of service:  Completed, signed off Medicare Important Message given?   (If response is "NO", the following Medicare IM given date fields will be blank) Date Medicare IM given:   Date Additional Medicare IM given:    Discharge Disposition:  [facility]  Per UR Regulation:  Reviewed for med. necessity/level of care/duration of stay  If discussed at [place] of Stay Meetings, dates discussed:    Comments:  04-22-13 Jacqlyn Krauss, RN,BSN [phone] Plan for d/c to [facility] for SNF today.

## 2013-04-19 NOTE — Progress Notes (Signed)
Cardiac rhythm strip printed showing pauses when pt is in a NSR. Other common rhythms have been Mobitz II and CHB, in the 30's. Pt has been asymptomatic, besides the confusion. which the friend who brought pt to ED says is normal for the pt.

## 2013-04-19 NOTE — ED Provider Notes (Signed)
Medical screening examination/treatment/procedure(s) were conducted as a shared visit with resident-physician practitioner(s) and myself.  I personally evaluated the patient during the encounter.  Pt is a 80 y.o. female with pmhx as above presenting with 2nd degree type II AVB on EKG at outpt PCP visit today.  Pt found to have resolution of 2nd deg type II block on initial EKG in dept and was without complaint on my exam, with benign cardiopulmonary exam.  Given extremes of age and unstable arrhythmia confirmed on outside EKG, cardiology consulted for admission.    EKG Interpretation    Date/Time:  Thursday April 18 2013 12:29:32 EST Ventricular Rate:  65 PR Interval:  173 QRS Duration: 110 QT Interval:  428 QTC Calculation: 445 R Axis:   -85 Text Interpretation:  Sinus rhythm Ventricular premature complex Left anterior fascicular block Anterior infarct, old Prior with 2nd degree AV block Confirmed by DOCHERTY  MD, [place] [phone]) on 04/18/2013 12:41:19 PM              Neta Ehlers, MD 04/19/13 1103

## 2013-04-19 NOTE — Interval H&P Note (Signed)
History and Physical Interval Note:  04/19/2013 10:32 AM  Upon my discussion with the patients family, she has been symptomatic with dizziness and fatigue.   She has mobitz II second degree AV block with transient complete heart block.   I would therefore recommend pacemaker implantation at this time.  Risks, benefits, alternatives to pacemaker implantation were discussed in detail with the patient today. The patient understands that the risks include but are not limited to bleeding, infection, pneumothorax, perforation, tamponade, vascular damage, renal failure, MI, stroke, death,  and lead dislodgement and wishes to proceed. We will therefore schedule the procedure at the next available time.    Vanessa Keller  has presented today for surgery, with the diagnosis of bradicardia  The various methods of treatment have been discussed with the patient and family. After consideration of risks, benefits and other options for treatment, the patient has consented to  Procedure(s): PERMANENT PACEMAKER INSERTION (N/A) as a surgical intervention .  The patient's history has been reviewed, patient examined, no change in status, stable for surgery.  I have reviewed the patient's chart and labs.  Questions were answered to the patient's satisfaction.     Thompson Grayer

## 2013-04-19 NOTE — Progress Notes (Signed)
Pt gets very confused at night. She continually pulled out IV lines, pulled off monitor and yelled out for help. Comforted by nurse repeatedly.

## 2013-04-19 NOTE — Progress Notes (Signed)
   ELECTROPHYSIOLOGY ROUNDING NOTE    Patient Name: Vanessa Keller Date of Encounter: 04/19/2013    SUBJECTIVE:Patient confused last night - no specific complaints this morning  TSH - 18.667  TELEMETRY: Reviewed telemetry pt in sinus rhythm, intermittent Mobitz I, intermittent multiple non conducted P waves overnight (asymptomatic) Filed Vitals:   04/19/13 0300 04/19/13 0400 04/19/13 0500 04/19/13 0600  BP: 106/52 126/61 102/61 99/38  Pulse: 61 59 57 52  Temp:   98.7 F (37.1 C)   TempSrc:   Oral   Resp: 14 15 13 14  Height:      Weight:      SpO2:   99%     Intake/Output Summary (Last 24 hours) at 04/19/13 0708 Last data filed at 04/18/13 2300  Gross per 24 hour  Intake    240 ml  Output    100 ml  Net    140 ml    CURRENT MEDICATIONS: . benazepril  5 mg Oral Daily  . hydrochlorothiazide  12.5 mg Oral Daily  . levothyroxine  50 mcg Oral QAC breakfast  . sodium chloride  3 mL Intravenous Q12H    LABS: Basic Metabolic Panel:  Recent Labs  04/18/13 1228 04/19/13 0220  NA 143 147  K 3.4* 4.1  CL 105 111  CO2 26 26  GLUCOSE 77 98  BUN 20 19  CREATININE 0.78 0.79  CALCIUM 8.8 8.7   CBC:  Recent Labs  04/18/13 1228 04/19/13 0220  WBC 9.6 9.9  HGB 12.6 12.2  HCT 37.7 36.1  MCV 94.0 93.8  PLT 238 223   Thyroid Function Tests:  Recent Labs  04/18/13 1228  TSH 18.667*    Radiology/Studies:  Dg Chest Portable 1 View 04/18/2013   CLINICAL DATA:  Irregular heartbeat  EXAM: PORTABLE CHEST - 1 VIEW  COMPARISON:  Portable exam 1250 hr without priors for comparison  FINDINGS: Patient's hand projects over inferior thorax.  Borderline enlargement of cardiac silhouette.  Atherosclerotic calcification aorta.  Mediastinal contours and pulmonary vascularity normal.  Lungs minimally hyperinflated but clear.  No pleural effusion or pneumothorax.  Bones diffusely demineralized.  Numerous EKG leads project over chest.  IMPRESSION: Borderline enlargement of cardiac  silhouette.  No acute abnormalities.   Electronically Signed   By: Mark  Boles M.D.   On: 04/18/2013 13:13   PHYSICAL EXAM Well developed and nourished in no acute distress HENT normal Neck supple with JVP-flat Clear Regular rate and rhythm, no murmurs or gallops Abd-soft with active BS No Clubbing cyanosis edema Skin-warm and dry A & Oriented  Grossly normal sensory and motor function   Principal Problem:   AV block, 2nd degree presumed Mobitz with 2:1 conduction Active Problems:   HYPOTHYROIDISM   Hypertension   Hypokalemia    Now with high grade heart block with multiple non conducted Pwave and ventricular escape beats Will proceed with pacing  Thyroid replacment indicated but time domain for restoration too long to avoid pacing 

## 2013-04-20 ENCOUNTER — Encounter (HOSPITAL_COMMUNITY): Payer: Self-pay | Admitting: Physician Assistant

## 2013-04-20 ENCOUNTER — Inpatient Hospital Stay (HOSPITAL_COMMUNITY): Payer: Medicare Other

## 2013-04-20 LAB — GLUCOSE, CAPILLARY: GLUCOSE-CAPILLARY: 130 mg/dL — AB (ref 70–99)

## 2013-04-20 NOTE — Progress Notes (Addendum)
SUBJECTIVE: Still confused this morning, denies chest pain, shortness of breath or incisional pain.   S/p PPM yesterday.  CURRENT MEDICATIONS: . benazepril  5 mg Oral Daily  .  ceFAZolin (ANCEF) IV  1 g Intravenous Q8H  . hydrochlorothiazide  12.5 mg Oral Daily  . levothyroxine  50 mcg Oral QAC breakfast  . sodium chloride  3 mL Intravenous Q12H      OBJECTIVE: Physical Exam: Filed Vitals:   04/19/13 0811 04/19/13 1257 04/19/13 2033 04/20/13 0000  BP: 159/80 127/70 147/61 116/62  Pulse: 66 66 67 71  Temp: 98.1 F (36.7 C)  98.7 F (37.1 C) 98.9 F (37.2 C)  TempSrc: Oral  Oral Oral  Resp: 16  18 18   Height:      Weight:      SpO2:  98% 97% 95%    Intake/Output Summary (Last 24 hours) at 04/20/13 0718 Last data filed at 04/19/13 1704  Gross per 24 hour  Intake  85.83 ml  Output    400 ml  Net -314.17 ml    Telemetry reveals sinus rhythm with intermittent ventricular pacing  GEN- The patient is well appearing, alert and oriented x 3 today.   Head- normocephalic, atraumatic Eyes-  Sclera clear, conjunctiva pink Ears- hearing intact Oropharynx- clear Neck- supple, no JVP Lymph- no cervical lymphadenopathy Lungs- Clear to ausculation bilaterally with only very slight decreased BS at L apex, normal work of breathing Heart- Regular rate and rhythm, no murmurs, rubs or gallops, PMI not laterally displaced GI- soft, NT, ND, + BS Extremities- no clubbing, cyanosis, or edema Skin- pacemaker site is without hematoma  LABS: Basic Metabolic Panel:  Recent Labs  04/18/13 1228 04/19/13 0220  NA 143 147  K 3.4* 4.1  CL 105 111  CO2 26 26  GLUCOSE 77 98  BUN 20 19  CREATININE 0.78 0.79  CALCIUM 8.8 8.7   CBC:  Recent Labs  04/18/13 1228 04/19/13 0220  WBC 9.6 9.9  HGB 12.6 12.2  HCT 37.7 36.1  MCV 94.0 93.8  PLT 238 223   Thyroid Function Tests:  Recent Labs  04/18/13 1228  TSH 18.667*    RADIOLOGY: Dg Chest Portable 1 View 04/18/2013    CLINICAL DATA:  Irregular heartbeat  EXAM: PORTABLE CHEST - 1 VIEW  COMPARISON:  Portable exam 1250 hr without priors for comparison  FINDINGS: Patient's hand projects over inferior thorax.  Borderline enlargement of cardiac silhouette.  Atherosclerotic calcification aorta.  Mediastinal contours and pulmonary vascularity normal.  Lungs minimally hyperinflated but clear.  No pleural effusion or pneumothorax.  Bones diffusely demineralized.  Numerous EKG leads project over chest.  IMPRESSION: Borderline enlargement of cardiac silhouette.  No acute abnormalities.   Electronically Signed   By: Lavonia Dana M.D.   On: 04/18/2013 13:13   CXR this morning - leads in stable position, small left apical ptx, final read pending  ASSESSMENT AND PLAN:  Principal Problem:   AV block, 2nd degree presumed Mobitz with 2:1 conduction Active Problems:   HYPOTHYROIDISM   Hypertension   Hypokalemia  1. Small left apical pneumothorax Clinically very stable Will place on high flow o2 and repeat CXR this afternoon.  2. Mobitz II second degree AV block s/p PPM Interrogation is reviewed and normal  3. Advanced age and dementia I agree with her family that she is not safe to live alone.  I will ask SW to assist with placement PT consult placed to assess mobility.   Wound care,  arm mobility, restrictions reviewed with patient/family.  Routine follow up scheduled.

## 2013-04-20 NOTE — Progress Notes (Signed)
The patient was agitated and combative at times during the night.  She pulled out her IV, which was restarted.  Her EKG was completed.  Family is now at the bedside.

## 2013-04-20 NOTE — Progress Notes (Signed)
Chest xray is mostly stable, perhaps slightly worse.  Vanessa Keller is clinically very stable. We will continue our current strategy overnight. I would not favor a chest tube or more aggressive care given her advanced age. Will repeat CXR in am.

## 2013-04-20 NOTE — Progress Notes (Deleted)
On assessment pt alert to self only. Pt refused to wear nonrebreather. 02 96% ra.

## 2013-04-20 NOTE — Plan of Care (Signed)
Problem: Phase I Progression Outcomes Goal: OOB as tolerated unless otherwise ordered Outcome: Completed/Met Date Met:  04/20/13 Up to BR with walker and assistance of 1 tol well NAD

## 2013-04-20 NOTE — Progress Notes (Addendum)
On assessment pt alert to self only. Pt refusing to wear non-rebreather. 02 96%ra. Encouraged pt to wear, however, pt became combative. MD on call made aware. Sitter is with pt.  Will cont to monitor pt.

## 2013-04-20 NOTE — Progress Notes (Signed)
PT Cancellation Note  Patient Details Name: Vanessa Keller MRN: [id number] DOB: Jul 23, 1915   Cancelled Treatment:    Reason Eval/Treat Not Completed: Patient not medically ready. Will try again tomorrow.   Beryl Balz 04/20/2013, 8:40 AM  [facility] PT [phone]

## 2013-04-21 ENCOUNTER — Inpatient Hospital Stay (HOSPITAL_COMMUNITY): Payer: Medicare Other

## 2013-04-21 NOTE — Progress Notes (Addendum)
Clinical Social Work Department CLINICAL SOCIAL WORK PLACEMENT NOTE 04/21/2013  Patient:  Vanessa Keller, Vanessa Keller  Account Number:  [id number] Admit date:  04/18/2013  Clinical Social Worker:  Tilden Fossa, Latanya Presser  Date/time:  04/21/2013 09:53 AM  Clinical Social Work is seeking post-discharge placement for this patient at the following level of care:   SKILLED NURSING   (*CSW will update this form in Epic as items are completed)     Patient/family provided with [place] Department of Clinical Social Work's list of facilities offering this level of care within the geographic area requested by the patient (or if unable, by the patient's family).  04/20/2013  Patient/family informed of their freedom to choose among providers that offer the needed level of care, that participate in Medicare, Medicaid or managed care program needed by the patient, have an available bed and are willing to accept the patient.    Patient/family informed of [facility]' ownership interest in [facility], as well as of the fact that they are under no obligation to receive care at this facility.  PASARR submitted to EDS on 04/21/2013 PASARR number received from EDS on 04/21/2013  FL2 transmitted to all facilities in geographic area requested by pt/family on  04/21/2013 FL2 transmitted to all facilities within larger geographic area on 04/21/2013  Patient informed that his/her managed care company has contracts with or will negotiate with  certain facilities, including the following:     Patient/family informed of bed offers received:  04/22/2013 Patient chooses bed at [facility] Physician recommends and patient chooses bed at    Patient to be transferred to [facility]  on  04/22/2013 Patient to be transferred to facility by [facility]  The following physician request were entered in Epic:   Additional Comments:  Tilden Fossa, MSW, [place] Worker [facility]  Dept. [phone]

## 2013-04-21 NOTE — Progress Notes (Signed)
   SUBJECTIVE: She denies chest pain, shortness of breath or incisional pain.   She is very confused at times.   CURRENT MEDICATIONS: . benazepril  5 mg Oral Daily  . hydrochlorothiazide  12.5 mg Oral Daily  . levothyroxine  50 mcg Oral QAC breakfast  . sodium chloride  3 mL Intravenous Q12H      OBJECTIVE: Physical Exam: Filed Vitals:   04/20/13 0800 04/20/13 1617 04/20/13 2000 04/21/13 0600  BP: 126/56 100/54 124/80 122/59  Pulse: 72 76 87 67  Temp: 98.8 F (37.1 C) 98.4 F (36.9 C) 98.7 F (37.1 C) 98.4 F (36.9 C)  TempSrc: Oral Oral    Resp: 18 18 18 18   Height:      Weight:      SpO2: 100% 100% 96% 97%    Intake/Output Summary (Last 24 hours) at 04/21/13 0749 Last data filed at 04/20/13 1100  Gross per 24 hour  Intake      3 ml  Output      0 ml  Net      3 ml    Telemetry reveals sinus rhythm with intermittent ventricular pacing  GEN- The patient is well appearing, alert and oriented x 3 today.   Head- normocephalic, atraumatic Eyes-  Sclera clear, conjunctiva pink Ears- hearing intact Oropharynx- clear Neck- supple, no JVP Lymph- no cervical lymphadenopathy Lungs- Clear to ausculation bilaterally with only very slight decreased BS at L apex (improved), normal work of breathing Heart- Regular rate and rhythm, no murmurs, rubs or gallops, PMI not laterally displaced GI- soft, NT, ND, + BS Extremities- no clubbing, cyanosis, or edema Skin- pacemaker site is without hematoma  LABS: Basic Metabolic Panel:  Recent Labs  04/18/13 1228 04/19/13 0220  NA 143 147  K 3.4* 4.1  CL 105 111  CO2 26 26  GLUCOSE 77 98  BUN 20 19  CREATININE 0.78 0.79  CALCIUM 8.8 8.7   CBC:  Recent Labs  04/18/13 1228 04/19/13 0220  WBC 9.6 9.9  HGB 12.6 12.2  HCT 37.7 36.1  MCV 94.0 93.8  PLT 238 223   Thyroid Function Tests:  Recent Labs  04/18/13 1228  TSH 18.667*    CXR this morning - leads in stable position, pneumothorax is reduced in  size  ASSESSMENT AND PLAN:  Principal Problem:   AV block, 2nd degree presumed Mobitz with 2:1 conduction Active Problems:   HYPOTHYROIDISM   Hypertension   Hypokalemia  1. Small left apical pneumothorax Clinically very stable.  Improving though she did not wear her O2 overnight.  I will encourage her to wear it today. Repeat xray tomorrow  2. Mobitz II second degree AV block s/p PPM  3. Advanced age and dementia I agree with her family that she is not safe to live alone.  I will ask SW to assist with placement PT consult placed to assess mobility.  OK to ambulate today.   Wound care, arm mobility, restrictions reviewed with patient/family.  Routine follow up scheduled.

## 2013-04-21 NOTE — Progress Notes (Signed)
Clinical Social Work Department BRIEF PSYCHOSOCIAL ASSESSMENT 04/21/2013  Patient:  Vanessa Keller, Vanessa Keller     Account Number:  [id number]     Admit date:  04/18/2013  Clinical Social Worker:  Su Monks  Date/Time:  04/20/2013 08:51 AM  Referred by:  Physician  Date Referred:  04/19/2013 Referred for  SNF Placement   Other Referral:   Interview type:  Family Other interview type:   Weekend CSW spoke with patient's sister Rosanne Gutting [phone] and niece Layla Maw    PSYCHOSOCIAL DATA Living Status:  ALONE Admitted from facility:   Level of care:   Primary support name:  Rosanne Gutting [phone] Primary support relationship to patient:  SIBLING Degree of support available:   Good    CURRENT CONCERNS Current Concerns  Post-Acute Placement   Other Concerns:   Patient's ability to care for herself at home due to being disoriented and memory deficits.    SOCIAL WORK ASSESSMENT / PLAN Weekend CSW was approached by patient's sister Earlie Server and niece Monte in the hallway with concerns regarding patient's ability to care for herself at home, due to confusion, wandering, and  memory deficits. Weekend CSW explained to family options of SNF and [facility] and that options available would also be determined by patient's insurance. Family states that HCPOA is patient's lawyer [name] [phone]. Family wanting sitter to be with patient and she was apparently trying to wander. Weekend CSW spoke with RN and Armed forces logistics/support/administrative officer on unit, no sitter available at this time but information was provided to family regarding outside agency that could provide sitter services. Weekend CSW provided family support and informed them that CSW would be in touch with updates.    Weekend CSW met with patient around 3 pm to assess and discuss discharge plans. Patient was friendly and agreeable to speaking. Patient was disoriented during conversation and did not know where she was.  Weekend CSW thanked patient for her time.    Weekend CSW attempted to contact [facility] [phone], left voicemail message. Per voicemail, HCPOA will not be back in office until Monday 04/22/13.    Weekend CSW contacted patient's sister Earlie Server, who provided permission for CSW to initiate facility search. Sister Earlie Server states that she will come to visit patient again on Monday 04/22/13. CSW assured sister that CSW will be in contact regarding any updates.   Assessment/plan status:  Information/Referral to [employer] Other assessment/ plan:   Information/referral to community resources:   Weekend CSW to initiate facility search in [place] CSW to follow for discharge planning and provide support.    PATIENT'S/FAMILY'S RESPONSE TO PLAN OF CARE: Patient was disoriented during assessment and not able to provide feedback or permission regarding SNF placement at d/c. Sister and neice wanting SNF placement, as they have concerns that they cannot provide adequate care for patient anymore. Family and patient both verbalized appreciation for CSW assistance.     Tilden Fossa, MSW, [place] Clinical Social Worker [facility] [phone]

## 2013-04-21 NOTE — Evaluation (Signed)
Physical Therapy Evaluation Patient Details Name: Vanessa Keller MRN: [id number] DOB: 08-02-1915 Today's Date: 04/21/2013 Time: 1212-1240 PT Time Calculation (min): 28 min  PT Assessment / Plan / Recommendation History of Present Illness  Pt adm with AV block and had pacemaker placed.  Pt with small pneumothorax post-op.  Clinical Impression  Pt admitted with above. Pt currently with functional limitations due to the deficits listed below (see PT Problem List).  Pt will benefit from skilled PT to increase their independence and safety with mobility to allow discharge to the venue listed below.       PT Assessment  Patient needs continued PT services    Follow Up Recommendations  SNF    Does the patient have the potential to tolerate intense rehabilitation      Barriers to Discharge Decreased caregiver support      Equipment Recommendations  Rolling walker with 5" wheels    Recommendations for Other Services     Frequency Min 3X/week    Precautions / Restrictions Precautions Precautions: Fall;ICD/Pacemaker   Pertinent Vitals/Pain Pt on high flow O2 due to pneumothorax.      Mobility  Bed Mobility Overal bed mobility: Needs Assistance Bed Mobility: Supine to Sit Supine to sit: Min assist;HOB elevated General bed mobility comments: Assist to bring trunk up. Transfers Overall transfer level: Needs assistance Equipment used: Rolling walker (2 wheeled) Transfers: Sit to/from Stand Sit to Stand: Min assist;Mod assist General transfer comment: Assist to bring hips up.  More assist needed coming up from low toilet. Ambulation/Gait Ambulation/Gait assistance: Min assist;Mod assist Ambulation Distance (Feet): 140 Feet Assistive device: Rolling walker (2 wheeled) Gait Pattern/deviations: Step-through pattern;Staggering left;Staggering right;Trunk flexed General Gait Details: Verbal cues to stay closer to walker. Staggerred x 3-4 requiring mod A to correct.    Exercises      PT Diagnosis: Difficulty walking;Generalized weakness  PT Problem List: Decreased strength;Decreased activity tolerance;Decreased balance;Decreased mobility;Decreased knowledge of use of DME;Decreased safety awareness;Decreased knowledge of precautions PT Treatment Interventions: DME instruction;Gait training;Functional mobility training;Therapeutic activities;Therapeutic exercise;Balance training;Patient/family education     PT Goals(Current goals can be found in the care plan section) Acute Rehab PT Goals Patient Stated Goal: Pt would like to stay independent PT Goal Formulation: With patient Time For Goal Achievement: 04/28/13 Potential to Achieve Goals: Good  Visit Information  Last PT Received On: 04/21/13 Assistance Needed: +1 History of Present Illness: Pt adm with AV block and had pacemaker placed.  Pt with small pneumothorax post-op.       Prior Functioning  Home Living Family/patient expects to be discharged to:: Skilled nursing facility Living Arrangements: Alone Prior Function Level of Independence: Independent with assistive device(s) Comments: Amb with cane.  Still drives. Communication Communication: HOH    Cognition  Cognition Arousal/Alertness: Awake/alert Behavior During Therapy: WFL for tasks assessed/performed Overall Cognitive Status: No family/caregiver present to determine baseline cognitive functioning Area of Impairment: Orientation;Memory Orientation Level: Disoriented to;Time;Situation Memory: Decreased short-term memory    Extremity/Trunk Assessment Upper Extremity Assessment Upper Extremity Assessment: Generalized weakness Lower Extremity Assessment Lower Extremity Assessment: Generalized weakness   Balance Balance Overall balance assessment: Needs assistance Sitting-balance support: Bilateral upper extremity supported Sitting balance-Leahy Scale: Good Standing balance support: Bilateral upper extremity supported Standing balance-Leahy  Scale: Poor  End of Session PT - End of Session Equipment Utilized During Treatment: Gait belt;Oxygen Activity Tolerance: Patient tolerated treatment well Patient left: in chair;with call bell/phone within reach;with nursing/sitter in room Freight forwarder) Nurse Communication: Mobility status  GP  Dartanian Knaggs 04/21/2013, 1:42 PM  [place]

## 2013-04-22 ENCOUNTER — Inpatient Hospital Stay (HOSPITAL_COMMUNITY): Payer: Medicare Other

## 2013-04-22 ENCOUNTER — Other Ambulatory Visit: Payer: Self-pay | Admitting: Cardiology

## 2013-04-22 DIAGNOSIS — I441 Atrioventricular block, second degree: Secondary | ICD-10-CM

## 2013-04-22 DIAGNOSIS — J95811 Postprocedural pneumothorax: Secondary | ICD-10-CM

## 2013-04-22 DIAGNOSIS — Z950 Presence of cardiac pacemaker: Secondary | ICD-10-CM

## 2013-04-22 NOTE — H&P (Signed)
Pt presents with asymptomatic 2:1 AVB with QRS escape morphology similar to intinsic conduction suggestive of av nodal block as opposed to his purkinje system block  However given age and nonspecific symptoms will observe overnight

## 2013-04-22 NOTE — Progress Notes (Signed)
CSW (Clinical Education officer, museum) prepared pt dc packet and placed with shadow chart. CSW to arrange non-emergent ambulance transport once FL2 has been signed (RN already made MD aware that FL2 needs to be signed). Pt, pt family, pt nurse, and facility informed. CSW signing off.  [place], [place]

## 2013-04-22 NOTE — Discharge Summary (Signed)
ELECTROPHYSIOLOGY DISCHARGE SUMMARY    Patient ID: Vanessa Keller,  MRN: [id number], DOB/AGE: 1915-08-23 80 y.o.  Admit date: 04/18/2013 Discharge date: 04/22/2013  Primary Care Physician: Vanessa Ace, MD  Primary Cardiologist: New to [facility]; Vanessa Gainey, MD  Primary Discharge Diagnosis:  1. Mobitz II AV block s/p PPM implantation 2. Small left apical pneumothorax s/p PPM implantation 3. Dementia  Secondary Discharge Diagnoses:  1. Hypothyroidism 2. Advanced age 48. Hearing loss  Procedures This Admission:   1. Dual chamber PPM implantation 04/19/2013 RA lead - [employer] model [id number] (serial number [id number]) right atrial lead RV lead - [employer] model [id number] (serial number [id number]) right ventricular lead  Device - [address] Jude Medical [address]urity DR model [id number] (serial number [id number] ) pacemaker  History and Hospital Course:  Please see admission H&P for full details of history. Briefly, Vanessa Keller is a 80 year old woman with hypothyroidism who was admitted 04/18/2013 with 2:1 AV block. TSH 18.6. Thyroid replacment indicated but time domain for restoration too long to avoid pacing. Therefore, with high grade AV block she underwent dual chamber PPM implantation on 04/19/2013. Post operatively, Vanessa Keller developed a small left apical pneumothorax. She was treated with high flow O2. She did not require chest tube placement. She remained hemodynamically stable and afebrile. Her device interrogation shows normal PPM function with stable lead parameters/measurements. Her implant site is intact without significant bleeding or hematoma. Of note, during her hospitalization, she had confusion and at times became combative with nursing staff due to disorientation. Given her age and advanced dementia, Vanessa Keller and her family agreed that she is not safe to live alone. PT consultation was done and recommended SNF. Social work assisted with DC planning and placement. She has been given discharge  instructions including wound care and activity restrictions. She will follow-up in 10 days for wound check. She will have a repeat chest x-ray also done at that time (order entered into EPIC). She will follow-up with her PCP for management of hypothyroidism. There were no changes made to her medications. She has been seen, examined and deemed stable for discharge today to SNF by Vanessa Keller.  Discharge Vitals: Blood pressure 100/62, pulse 62, temperature 98.9 F (37.2 C), temperature source Oral, resp. rate 16, height 5\' 5"  (1.651 m), weight 107 lb 9.4 oz (48.8 kg), SpO2 100.00%.   Labs: Lab Results  Component Value Date   WBC 9.9 04/19/2013   HGB 12.2 04/19/2013   HCT 36.1 04/19/2013   MCV 93.8 04/19/2013   PLT 223 04/19/2013     Recent Labs Lab 04/19/13 0220  NA 147  K 4.1  CL 111  CO2 26  BUN 19  CREATININE 0.79  CALCIUM 8.7  GLUCOSE 98    Disposition:  The patient is being discharged in stable condition.  Follow-up:     Follow-up Information   Follow up with [facility] On 04/29/2013. (At 2:00 PM for wound check (will also need repeat chest x-ray))    Specialty:  Cardiology   Contact information:   [address], [place] [address] [phone]      Follow up with Vanessa Dawson, MD On 04/25/2013. (At 11:00 AM for hospital follow-up)    Specialty:  Internal Medicine   Contact information:   [place] [address] [phone]      Discharge Medications:    Medication List  levothyroxine 50 MCG tablet  Commonly known as:  SYNTHROID, LEVOTHROID  Take 50 mcg by mouth daily before breakfast.       Duration of Discharge Encounter: Greater than 30 minutes including physician time.  Manson Passey, PA-C 04/22/2013, 11:52 AM   Thompson Grayer MD

## 2013-04-22 NOTE — Progress Notes (Signed)
CSW (Clinical Education officer, museum) spoke with POA [name] who said he is the backup HCPOA and informed CSW that Sharlee Blew [phone]) is the primary. CSW unable to reach Ms. Moore. CSW gave bed offers to Mr. Sabra Heck who informed CSW that [facility] would be the best fit for the pt. CSW inquired as to whether Mr. Sabra Heck would be able to do paperwork with facility. Mr. Sabra Heck unable to do so today however he did say pt sister Ms. Ouida Sills was named as Research scientist (life sciences) as well. CSW spoke with pt sister in pt room and informed of Mr. Sabra Heck choice for [facility]. CSW spoke with facility as well. CSW to inform pt family as soon as CSW is aware of time that facility would like to meet with family to do paperwork.   CSW did also speak to pt regarding rehab. However, pt solely stated she would like to be close to home. All facilities are about equal distance from pt home address.   [place], [place]

## 2013-04-22 NOTE — Progress Notes (Signed)
Per Dr. Rayann Heman, patient stable for discharge to SNF today. She will need a repeat chest x-ray in one week for follow-up regarding small left apical pneumothorax. Order for PA and lateral chest x-ray has been entered. Results to Dr. Rayann Heman.

## 2013-04-22 NOTE — Progress Notes (Addendum)
DC orders received.  Patient stable with no S/S of distress.  Medication and discharge information reviewed with personal at [facility]) & family.  Patient awaiting DC to SNF via ambulance. [place], Ardeth Sportsman

## 2013-04-22 NOTE — Discharge Instructions (Signed)
° °  Supplemental Discharge Instructions for  Pacemaker/Defibrillator Patients  Activity No heavy lifting or vigorous activity with your left/right arm for 6 to 8 weeks.  Do not raise your left/right arm above your head for one week.  Gradually raise your affected arm as drawn below.           01/14                      01/15                       01/16                      01/17       WOUND CARE   Keep the wound area clean and dry.  Do not get this area wet for one week. No showers for one week; you may shower on 04/28/2012.   The tape/steri-strips on your wound will fall off; do not pull them off.  No bandage is needed on the site.  DO  NOT apply any creams, oils, or ointments to the wound area.   If you notice any drainage or discharge from the wound, any swelling or bruising at the site, or you develop a fever > 101? F after you are discharged home, call the office at once.  Special Instructions   You are still able to use cellular telephones; use the ear opposite the side where you have your pacemaker/defibrillator.  Avoid carrying your cellular phone near your device.   When traveling through airports, show security personnel your identification card to avoid being screened in the metal detectors.  Ask the security personnel to use the hand wand.   Avoid arc welding equipment, MRI testing (magnetic resonance imaging), TENS units (transcutaneous nerve stimulators).  Call the office for questions about other devices.   Avoid electrical appliances that are in poor condition or are not properly grounded.   Microwave ovens are safe to be near or to operate.

## 2013-04-22 NOTE — Progress Notes (Signed)
   SUBJECTIVE: She denies chest pain, shortness of breath or incisional pain.   She is very confused at times.   CURRENT MEDICATIONS: . benazepril  5 mg Oral Daily  . hydrochlorothiazide  12.5 mg Oral Daily  . levothyroxine  50 mcg Oral QAC breakfast  . sodium chloride  3 mL Intravenous Q12H      OBJECTIVE: Physical Exam: Filed Vitals:   04/20/13 1617 04/21/13 0600 04/21/13 2100 04/22/13 0500  BP:  122/59 103/48 113/51  Pulse:  67 63 62  Temp:  98.4 F (36.9 C) 98.1 F (36.7 C) 98.9 F (37.2 C)  TempSrc: Oral     Resp:  18 18 16   Height:      Weight:      SpO2:  97% 100% 100%    Intake/Output Summary (Last 24 hours) at 04/22/13 0758 Last data filed at 04/21/13 1700  Gross per 24 hour  Intake    723 ml  Output      0 ml  Net    723 ml    Telemetry reveals sinus rhythm with intermittent ventricular pacing  GEN- The patient is well appearing, alert and oriented x 3 today.   Head- normocephalic, atraumatic Eyes-  Sclera clear, conjunctiva pink Ears- hearing intact Oropharynx- clear Neck- supple, no JVP Lymph- no cervical lymphadenopathy Lungs- Clear to ausculation bilaterally, normal work of breathing Heart- Regular rate and rhythm, no murmurs, rubs or gallops, PMI not laterally displaced GI- soft, NT, ND, + BS Extremities- no clubbing, cyanosis, or edema Skin- pacemaker site is without hematoma  LABS: Basic Metabolic Panel: No results found for this basename: NA, K, CL, CO2, GLUCOSE, BUN, CREATININE, CALCIUM, MG, PHOS,  in the last 72 hours CBC: No results found for this basename: WBC, NEUTROABS, HGB, HCT, MCV, PLT,  in the last 72 hours Thyroid Function Tests: No results found for this basename: TSH, T4TOTAL, FREET3, T3FREE, THYROIDAB,  in the last 72 hours  CXR this morning - leads in stable position, pneumothorax is reduced in size  ASSESSMENT AND PLAN:  Principal Problem:   AV block, 2nd degree presumed Mobitz with 2:1 conduction Active Problems:  HYPOTHYROIDISM   Hypertension   Hypokalemia  1. Small left apical pneumothorax Clinically very stable.  Much improved on Xray Repeat xray tomorrow  2. Mobitz II second degree AV block s/p PPM  3. Advanced age and dementia I agree with her family that she is not safe to live alone.  I will ask SW to assist with placement PT recommends SNF.  OK to ambulate today.   Wound care, arm mobility, restrictions reviewed with patient/family.  Routine follow up scheduled.  Can discharge to SNF when bed is available.

## 2013-04-24 ENCOUNTER — Non-Acute Institutional Stay (SKILLED_NURSING_FACILITY): Payer: Medicare Other | Admitting: Adult Health

## 2013-04-24 DIAGNOSIS — I441 Atrioventricular block, second degree: Secondary | ICD-10-CM

## 2013-04-24 DIAGNOSIS — E039 Hypothyroidism, unspecified: Secondary | ICD-10-CM

## 2013-04-24 DIAGNOSIS — F0390 Unspecified dementia without behavioral disturbance: Secondary | ICD-10-CM

## 2013-04-25 ENCOUNTER — Ambulatory Visit: Payer: Medicare Other | Admitting: Internal Medicine

## 2013-04-25 DIAGNOSIS — Z0289 Encounter for other administrative examinations: Secondary | ICD-10-CM

## 2013-04-26 ENCOUNTER — Non-Acute Institutional Stay (SKILLED_NURSING_FACILITY): Payer: Medicare Other | Admitting: Internal Medicine

## 2013-04-26 DIAGNOSIS — I441 Atrioventricular block, second degree: Secondary | ICD-10-CM

## 2013-04-26 DIAGNOSIS — E039 Hypothyroidism, unspecified: Secondary | ICD-10-CM

## 2013-04-26 DIAGNOSIS — J9383 Other pneumothorax: Secondary | ICD-10-CM

## 2013-04-26 DIAGNOSIS — F0390 Unspecified dementia without behavioral disturbance: Secondary | ICD-10-CM

## 2013-04-27 DIAGNOSIS — J9383 Other pneumothorax: Secondary | ICD-10-CM | POA: Insufficient documentation

## 2013-04-27 DIAGNOSIS — F0390 Unspecified dementia without behavioral disturbance: Secondary | ICD-10-CM | POA: Insufficient documentation

## 2013-04-27 NOTE — Progress Notes (Signed)
HISTORY & PHYSICAL  DATE: 04/26/2013   FACILITY: [facility]  LEVEL OF CARE: SNF (31)  ALLERGIES:  No Known Allergies  CHIEF COMPLAINT:  Manage Mobitz 2 AV block, hypothyroidism and dementia  HISTORY OF PRESENT ILLNESS: Patient is a 80 year old Caucasian female.  MORBITZ 11 AV BLOCK: Patient was hospitalized with 2:1 AV block. She underwent dual-chamber permanent pacemaker implantation and tolerated the procedure well. After hospitalization she is admitted to this facility for short-term rehabilitation. She denies chest pain, shortness of breath, palpitations or dizziness.  DEMENTIA: The dementia remaines stable and continues to function adequately in the current living environment with supervision.  The patient has had little changes in behavior. No complications noted from the medications presently being used.  HYPOTHYROIDISM: The hypothyroidism is unstable. No complications noted from the medications presently being used.  The patient denies fatigue or constipation.  Last TSH 18.6.  PAST MEDICAL HISTORY :  Past Medical History  Diagnosis Date  . Hypothyroidism   . Osteoporosis   . Urinary urgency   . Urinary incontinence   . Hearing loss     wears hearing aids  . Secondary hyperparathyroidism   . Fall 08/29/2010  . Dysrhythmia     2  MOBITIZ   TYPE 2  . Pacemaker 04/19/2013    [place] DR  model [id number] (serial number  [id number] ) DUAL CHAMBER  DR Caryl Comes    PAST SURGICAL HISTORY: Past Surgical History  Procedure Laterality Date  . Removal of carotid tumor  1997    left sx nonmalignant  . Insert / replace / remove pacemaker  04/19/2013    DUAL CHAMBER DR Caryl Comes  . Appendectomy    . Cataract extraction    . Permanent pacemaker insertion  Jan. 2015    [facility] Jude Medical Assurity DR  model [id number] [id number] (serial number  [id number] ) dual-lead pacemaker    SOCIAL HISTORY:  reports that she has never smoked. She has never used smokeless  tobacco. She reports that she does not drink alcohol or use illicit drugs.  FAMILY HISTORY: None  CURRENT MEDICATIONS: Reviewed per MAR  REVIEW OF SYSTEMS:  See HPI otherwise 14 point ROS is negative.  PHYSICAL EXAMINATION  VS:  T 98.1        P 78      RR 18     BP 121/66      POX% 97          GENERAL: no acute distress, normal body habitus EYES: conjunctivae normal, sclerae normal, normal eye lids MOUTH/THROAT: lips without lesions,no lesions in the mouth,tongue is without lesions,uvula elevates in midline NECK: supple, trachea midline, no neck masses, no thyroid tenderness, no thyromegaly LYMPHATICS: no LAN in the neck, no supraclavicular LAN RESPIRATORY: breathing is even & unlabored, BS CTAB CARDIAC: RRR, no murmur,no extra heart sounds, no edema GI:  ABDOMEN: abdomen soft, normal BS, no masses, no tenderness  LIVER/SPLEEN: no hepatomegaly, no splenomegaly MUSCULOSKELETAL: HEAD: normal to inspection & palpation BACK: no kyphosis, scoliosis or spinal processes tenderness EXTREMITIES: LEFT UPPER EXTREMITY:  range of motion unable to perform, decreased strength & tone RIGHT UPPER EXTREMITY:   range of motion unable to perform, decreased strength & tone LEFT LOWER EXTREMITY:   range of motion unable to perform, decreased strength & tone RIGHT LOWER EXTREMITY:  range of motion unable to perform, decreased strength & tone PSYCHIATRIC: the patient is alert & oriented to person, affect &  behavior appropriate  LABS/RADIOLOGY:  Labs reviewed: Basic Metabolic Panel:  Recent Labs  09/28/12 1135 04/18/13 1228 04/19/13 0220  NA 140 143 147  K 4.1 3.4* 4.1  CL 104 105 111  CO2 28 26 26   GLUCOSE 88 77 98  BUN 15 20 19   CREATININE 0.7 0.78 0.79  CALCIUM 9.3 8.8 8.7   CBC:  Recent Labs  04/18/13 1228 04/19/13 0220  WBC 9.6 9.9  HGB 12.6 12.2  HCT 37.7 36.1  MCV 94.0 93.8  PLT 238 223   CBG:  Recent Labs  04/20/13 0745  GLUCAP 130*   PORTABLE CHEST - 1 VIEW     COMPARISON:  04/21/2013   FINDINGS: Interval improvement in small left apical pneumothorax.   Small left effusion. Negative for heart failure or edema. Cardiac enlargement. Dual lead pacemaker unchanged.   IMPRESSION: Improvement in and left apical pneumothorax which is now minimal. Small left effusion.   ASSESSMENT/PLAN:  2:1 AV block-status post pacemaker implantation. Hypothyroidism-unstable problem. He levothyroxine was initiated. Recheck TSH in 6 weeks. Dementia -- stable Left pneumothorax-resolving.  I have reviewed patient's medical records received at admission/from hospitalization.  CPT CODE: 95621

## 2013-04-29 ENCOUNTER — Ambulatory Visit (INDEPENDENT_AMBULATORY_CARE_PROVIDER_SITE_OTHER): Payer: Medicare Other | Admitting: *Deleted

## 2013-04-29 DIAGNOSIS — I442 Atrioventricular block, complete: Secondary | ICD-10-CM

## 2013-04-29 LAB — MDC_IDC_ENUM_SESS_TYPE_INCLINIC
Battery Voltage: 3.13 V
Brady Statistic RA Percent Paced: 0 %
Date Time Interrogation Session: 20150119143134
Implantable Pulse Generator Model: 2240
Lead Channel Impedance Value: 550 Ohm
Lead Channel Impedance Value: 625 Ohm
Lead Channel Pacing Threshold Amplitude: 0.5 V
Lead Channel Pacing Threshold Pulse Width: 0.4 ms
Lead Channel Pacing Threshold Pulse Width: 0.4 ms
Lead Channel Sensing Intrinsic Amplitude: 11.9 mV
Lead Channel Setting Pacing Amplitude: 0.75 V
Lead Channel Setting Pacing Amplitude: 3.5 V
Lead Channel Setting Sensing Sensitivity: 2 mV
MDC IDC MSMT BATTERY REMAINING LONGEVITY: 142.8 mo
MDC IDC MSMT LEADCHNL RA PACING THRESHOLD AMPLITUDE: 0.5 V
MDC IDC MSMT LEADCHNL RA SENSING INTR AMPL: 5 mV
MDC IDC PG SERIAL: 7585584
MDC IDC SET LEADCHNL RV PACING PULSEWIDTH: 0.4 ms
MDC IDC STAT BRADY RV PERCENT PACED: 98 %

## 2013-04-29 NOTE — Progress Notes (Signed)

## 2013-04-30 ENCOUNTER — Other Ambulatory Visit: Payer: Self-pay | Admitting: Internal Medicine

## 2013-05-09 ENCOUNTER — Encounter: Payer: Self-pay | Admitting: Internal Medicine

## 2013-05-28 ENCOUNTER — Encounter: Payer: Self-pay | Admitting: Adult Health

## 2013-05-28 NOTE — Progress Notes (Signed)
Patient ID: Vanessa Keller, female   DOB: 1915-12-06, 80 y.o.   MRN: [id number]               PROGRESS NOTE  DATE: 04/24/2013  FACILITY: Nursing Home Location: [facility]  LEVEL OF CARE: SNF (31)  Acute Visit  CHIEF COMPLAINT:  Follow-up hospitalization  HISTORY OF PRESENT ILLNESS: This is a 80 year old female who has been admitted to [facility] place on 04/22/13 from [facility] with primary discharge diagnosis of High grade heart block S/P Pacemake implant. She has been admitted for a short-term rehabilitation.  REASSESSMENT OF ONGOING PROBLEM(S):  HYPOTHYROIDISM: The hypothyroidism is unstable. No complications noted from the medications presently being used.  The patient denies fatigue or constipation. 1/15 TSH 17.7140  DEMENTIA: The dementia remaines stable and continues to function adequately in the current living environment with supervision.  The patient has had little changes in behavior.    PAST MEDICAL HISTORY : Reviewed.  No changes.  CURRENT MEDICATIONS: Reviewed per [facility]  REVIEW OF SYSTEMS:  GENERAL: no change in appetite, no fatigue, no weight changes, no fever, chills or weakness RESPIRATORY: no cough, SOB, DOE, wheezing, hemoptysis CARDIAC: no chest pain, edema or palpitations GI: no abdominal pain, diarrhea, constipation, heart burn, nausea or vomiting  PHYSICAL EXAMINATION  VS:  See VS section  GENERAL: no acute distress, normal body habitus EYES: conjunctivae normal, sclerae normal, normal eye lids NECK: supple, trachea midline, no neck masses, no thyroid tenderness, no thyromegaly LYMPHATICS: no LAN in the neck, no supraclavicular LAN RESPIRATORY: breathing is even & unlabored, BS CTAB CARDIAC: RRR, no murmur,no extra heart sounds, no edema GI: abdomen soft, normal BS, no masses, no tenderness, no hepatomegaly, no splenomegaly PSYCHIATRIC: the patient is alert & oriented to person, affect & behavior  appropriate  LABS/RADIOLOGY: Labs reviewed: Basic Metabolic Panel:  Recent Labs  09/28/12 1135 04/18/13 1228 04/19/13 0220  NA 140 143 147  K 4.1 3.4* 4.1  CL 104 105 111  CO2 28 26 26   GLUCOSE 88 77 98  BUN 15 20 19   CREATININE 0.7 0.78 0.79  CALCIUM 9.3 8.8 8.7    CBC:  Recent Labs  04/18/13 1228 04/19/13 0220  WBC 9.6 9.9  HGB 12.6 12.2  HCT 37.7 36.1  MCV 94.0 93.8  PLT 238 223    CBG:  Recent Labs  04/20/13 0745  GLUCAP 130*     ASSESSMENT/PLAN:  High grade heart block S/P Pacemaker implant  Hypothyroidism - increase Synthroid to 75 mcg 1 tab PO Q D and tsh in 6 weeks  Dementia - stable   CPT CODE: 41660  Monina Vargas NP [facility] [phone]

## 2013-06-04 ENCOUNTER — Encounter: Payer: Self-pay | Admitting: *Deleted

## 2013-06-04 ENCOUNTER — Ambulatory Visit: Payer: Medicare Other | Admitting: Internal Medicine

## 2013-06-05 ENCOUNTER — Encounter: Payer: Self-pay | Admitting: Adult Health

## 2013-06-05 ENCOUNTER — Non-Acute Institutional Stay (SKILLED_NURSING_FACILITY): Payer: Medicare Other | Admitting: Adult Health

## 2013-06-05 DIAGNOSIS — I441 Atrioventricular block, second degree: Secondary | ICD-10-CM

## 2013-06-05 DIAGNOSIS — F0390 Unspecified dementia without behavioral disturbance: Secondary | ICD-10-CM

## 2013-06-05 DIAGNOSIS — E039 Hypothyroidism, unspecified: Secondary | ICD-10-CM

## 2013-06-05 NOTE — Progress Notes (Signed)
Patient ID: Vanessa Keller, female   DOB: 1915-12-07, 80 y.o.   MRN: [id number]               PROGRESS NOTE  DATE: 06/05/13  FACILITY: Nursing Home Location: [facility]  LEVEL OF CARE: SNF (31)  Acute Visit  CHIEF COMPLAINT:  Discharge Notes  HISTORY OF PRESENT ILLNESS: This is a 80 year old female who is for discharge to an ALF with Home health PT, OT, ST and Nursing. She has been admitted to [facility] place on 04/22/13 from [facility] with primary discharge diagnosis of High grade heart block S/P Pacemake implant. Patient was admitted to this facility for short-term rehabilitation after the patient's recent hospitalization.  Patient has completed SNF rehabilitation and therapy has cleared the patient for discharge.  REASSESSMENT OF ONGOING PROBLEM(S):  HYPOTHYROIDISM: The hypothyroidism is unstable. No complications noted from the medications presently being used.  The patient denies fatigue or constipation. 1/15 TSH 17.7140  DEMENTIA: The dementia remaines stable and continues to function adequately in the current living environment with supervision.  The patient has had little changes in behavior.    PAST MEDICAL HISTORY : Reviewed.  No changes.  CURRENT MEDICATIONS: Reviewed per [facility]  REVIEW OF SYSTEMS:  GENERAL: no change in appetite, no fatigue, no weight changes, no fever, chills or weakness RESPIRATORY: no cough, SOB, DOE, wheezing, hemoptysis CARDIAC: no chest pain, edema or palpitations GI: no abdominal pain, diarrhea, constipation, heart burn, nausea or vomiting  PHYSICAL EXAMINATION  GENERAL: no acute distress, normal body habitus NECK: supple, trachea midline, no neck masses, no thyroid tenderness, no thyromegaly LYMPHATICS: no LAN in the neck, no supraclavicular LAN RESPIRATORY: breathing is even & unlabored, BS CTAB CARDIAC: RRR, no murmur,no extra heart sounds, no edema GI: abdomen soft, normal BS, no masses, no tenderness, no hepatomegaly,  no splenomegaly PSYCHIATRIC: the patient is alert & oriented to person, affect & behavior appropriate  LABS/RADIOLOGY: Labs reviewed: Basic Metabolic Panel:  Recent Labs  09/28/12 1135 04/18/13 1228 04/19/13 0220  NA 140 143 147  K 4.1 3.4* 4.1  CL 104 105 111  CO2 28 26 26   GLUCOSE 88 77 98  BUN 15 20 19   CREATININE 0.7 0.78 0.79  CALCIUM 9.3 8.8 8.7    CBC:  Recent Labs  04/18/13 1228 04/19/13 0220  WBC 9.6 9.9  HGB 12.6 12.2  HCT 37.7 36.1  MCV 94.0 93.8  PLT 238 223    CBG:  Recent Labs  04/20/13 0745  GLUCAP 130*     ASSESSMENT/PLAN:  High grade heart block S/P Pacemaker implant  Hypothyroidism - recently increased Synthroid dosage and for repeat tsh   Dementia - stable   I have filled out patient's discharge paperwork and written prescriptions.  Patient will receive home health PT, OT, ST and Nursing.  Total discharge time: Less than 30 minutes Discharge time involved coordination of the discharge process with Education officer, museum, nursing staff and therapy department. Medical justification for home health services verified.  CPT CODE: 93570  Monina Vargas NP [facility] [phone]

## 2013-06-10 ENCOUNTER — Telehealth: Payer: Self-pay | Admitting: Internal Medicine

## 2013-06-10 NOTE — Telephone Encounter (Signed)
Pt needs order for hospital bed at [facility] room 347. [facility] phone # [phone].

## 2013-06-10 NOTE — Telephone Encounter (Signed)
Who is requesting this  Please call and get more info .to enable to  make correct order .  (I havent seen her since her hospitalization .)

## 2013-06-11 NOTE — Telephone Encounter (Signed)
[name] called.  See paperwork on your desk.

## 2013-06-12 NOTE — Telephone Encounter (Signed)
Not sure what dx code to use.  Please advise.  Thanks!

## 2013-06-12 NOTE — Telephone Encounter (Signed)
I dont know.  Ask  The people requesting  Why she needs a hospital bed and what  Diagnosis would work .  (Arthritis ?) Cognitive memory deficit?    Marland Kitchen

## 2013-06-12 NOTE — Telephone Encounter (Signed)
Ok to do this but i may not be able to do the paper work without an OV  haven't seen her since her hospitalization .

## 2013-06-13 ENCOUNTER — Telehealth: Payer: Self-pay | Admitting: Internal Medicine

## 2013-06-13 NOTE — Telephone Encounter (Signed)
Pt's niece is calling wanting to know if dr.panosh can send [facility] an order to use warm compresses on pt eye due to a sty. Monte would like a c/b to confirm if can be done.

## 2013-06-13 NOTE — Telephone Encounter (Signed)
Called and spoke with niece to advise of recommendations.  Pt will come into the office on 06/25/2013.  Prescriptions for compressions sent to [facility].

## 2013-06-13 NOTE — Telephone Encounter (Signed)
Should be evaluated. Compresses fine in interim but dx should be confirmed as eye infections can be serious.

## 2013-06-14 ENCOUNTER — Telehealth: Payer: Self-pay | Admitting: Family Medicine

## 2013-06-14 NOTE — Telephone Encounter (Signed)
Okay for warm compresses

## 2013-06-14 NOTE — Telephone Encounter (Signed)
Pt's niece states she spoke with an ophthalmologist and was told that it can take 7 to 10 days for the sty to clear up and compresses would be fine for now.

## 2013-06-14 NOTE — Telephone Encounter (Signed)
Sent to the front to be faxed. 

## 2013-06-14 NOTE — Telephone Encounter (Signed)
Vanessa Keller from [facility] is calling to request at order for the pt to be able to apply warm compresses to both eyes as needed for pain and discomfort.  Pt has styes. Please advise.  Thanks!

## 2013-06-14 NOTE — Telephone Encounter (Signed)
Left message for [name], LPN to return my call from [facility]. [phone]

## 2013-06-19 NOTE — Telephone Encounter (Signed)
Called and spoke to [employer], LPN.  Advised that I have received a request for the pt to have a hospital bed and that I needed a dx code.  At this time the pt does not have a dx code that will qualify her for the hospital bed.  Did send over an order for PT/OT to evaluate and treat.  Maybe then we will be able to find a dx code that will work.  Called Pete Miller's office and left a message on his machine informing him that I have send over new orders for the pt and that he can call back if there are any further questions.

## 2013-06-25 ENCOUNTER — Ambulatory Visit: Payer: Medicare Other | Admitting: Internal Medicine

## 2013-06-25 ENCOUNTER — Other Ambulatory Visit: Payer: Self-pay | Admitting: Internal Medicine

## 2013-06-26 ENCOUNTER — Telehealth: Payer: Self-pay

## 2013-06-26 ENCOUNTER — Ambulatory Visit (INDEPENDENT_AMBULATORY_CARE_PROVIDER_SITE_OTHER): Payer: Medicare Other | Admitting: Internal Medicine

## 2013-06-26 ENCOUNTER — Encounter: Payer: Self-pay | Admitting: Internal Medicine

## 2013-06-26 VITALS — BP 116/60 | HR 68 | Temp 99.2°F | Wt 113.0 lb

## 2013-06-26 DIAGNOSIS — R300 Dysuria: Secondary | ICD-10-CM | POA: Insufficient documentation

## 2013-06-26 DIAGNOSIS — F05 Delirium due to known physiological condition: Secondary | ICD-10-CM

## 2013-06-26 DIAGNOSIS — I441 Atrioventricular block, second degree: Secondary | ICD-10-CM

## 2013-06-26 DIAGNOSIS — E039 Hypothyroidism, unspecified: Secondary | ICD-10-CM

## 2013-06-26 DIAGNOSIS — R21 Rash and other nonspecific skin eruption: Secondary | ICD-10-CM

## 2013-06-26 DIAGNOSIS — Z09 Encounter for follow-up examination after completed treatment for conditions other than malignant neoplasm: Secondary | ICD-10-CM

## 2013-06-26 DIAGNOSIS — E559 Vitamin D deficiency, unspecified: Secondary | ICD-10-CM

## 2013-06-26 DIAGNOSIS — R413 Other amnesia: Secondary | ICD-10-CM

## 2013-06-26 LAB — POCT URINALYSIS DIPSTICK
BILIRUBIN UA: NEGATIVE
GLUCOSE UA: NEGATIVE
NITRITE UA: NEGATIVE
PH UA: 5.5
Protein, UA: NEGATIVE
RBC UA: NEGATIVE
Spec Grav, UA: 1.025
UROBILINOGEN UA: 0.2

## 2013-06-26 MED ORDER — LEVOTHYROXINE SODIUM 75 MCG PO TABS: 75.0000 ug | ORAL_TABLET | Freq: Every day | ORAL | Status: AC

## 2013-06-26 MED ORDER — DONEPEZIL HCL 5 MG PO TBDP: 5.0000 mg | ORAL_TABLET | Freq: Every day | ORAL | Status: DC

## 2013-06-26 NOTE — Telephone Encounter (Signed)
Look at recent lab work.

## 2013-06-26 NOTE — Patient Instructions (Addendum)
Will notify you  of labs when available.  Need to make sure  Thyroid is in range . Need  Daily help with cleaning bottom and use Caldesene  Daily to help heal the area.  May benefit from  Memory medication such as aricept  5 mg per day .  prescription given  Contact cardiology about  Pacer check follow up  And how this is arranged .  ROV in 3 months or as needed

## 2013-06-26 NOTE — Progress Notes (Signed)
Chief Complaint  Patient presents with  . Follow-up    HPI: Patient comes in today as follow up from hospitalization in January when she presented with second degree heart block and symptoms. She had some difficulties in the hospital with dementia and sundowning and was discharged to [facility] place and then change to [facility]. Her sister and niece come in today with her. They have no written information or medication list but noted that she was on thyroid medicine to [facility] cannot tell me the dose. They're asking for help to encourage walker use physical therapy referral has been done no recent falling. Her memory isn't great she is very hard of hearing.  She complains occasionally of her bottom hurting when she sits when I asked the patient she says it doesn't hurt right now. Is unclear what her level of care is but it appears to be basic assisted living medication allotment and food given. ROS: See pertinent positives and negatives per HPI. No current chest pain shortness of breath some increased urinary frequency apparently was found wandering today on the grounds of the side of the road  Past Medical History  Diagnosis Date  . Hypothyroidism   . Osteoporosis   . Urinary urgency   . Urinary incontinence   . Hearing loss     wears hearing aids  . Secondary hyperparathyroidism   . Fall 08/29/2010  . Dysrhythmia     2  MOBITIZ   TYPE 2  . Pacemaker 04/19/2013    [facility] Jude Medical Assurity DR  model [id number] (serial number  [id number] ) DUAL CHAMBER  DR Caryl Comes    History reviewed. No pertinent family history.  History   Social History  . Marital Status: Widowed    Spouse Name: N/A    Number of Children: N/A  . Years of Education: N/A   Social History Main Topics  . Smoking status: Never Smoker   . Smokeless tobacco: Never Used  . Alcohol Use: No  . Drug Use: No  . Sexual Activity: None   Other Topics Concern  . None   Social History Narrative   Widowed   Child died age 59 years   Plays bridge without problem   3 meals a day prepared for her   Moved to [facility]     Outpatient Encounter Prescriptions as of 06/26/2013  Medication Sig  . levothyroxine (SYNTHROID, LEVOTHROID) 75 MCG tablet Take 1 tablet (75 mcg total) by mouth daily before breakfast.  . [DISCONTINUED] levothyroxine (SYNTHROID, LEVOTHROID) 50 MCG tablet Take 75 mcg by mouth daily before breakfast.   . [DISCONTINUED] levothyroxine (SYNTHROID, LEVOTHROID) 75 MCG tablet Take 75 mcg by mouth daily before breakfast.  . donepezil (ARICEPT ODT) 5 MG disintegrating tablet Take 1 tablet (5 mg total) by mouth at bedtime.    EXAM:  BP 116/60  Pulse 68  Temp(Src) 99.2 F (37.3 C) (Oral)  Wt 113 lb (51.256 kg)  SpO2 99%  Cannot calculate BMI with a height equal to zero.  GENERAL: vitals reviewed and listed above, alert, hard of hearing appears well hydrated and in no acute distress beaches normal does not know my name. HEENT: atraumatic, conjunctiva  clear, no obvious abnormalities on inspection of external nose and ears NECK: no obvious masses on inspection palpation  LUNGS: clear to auscultation bilaterally, no wheezes, rales or rhonchi, good air movement CV: HRRR, no clubbing cyanosis or  peripheral edema nl cap refill  MS: moves all extremities without noticeable focal  abnormality walks with a walker. Peritoneum exam and showing severe irritated rash throughout the perianal no vesicles no obvious ulcer.  Lab Results  Component Value Date   WBC 9.9 04/19/2013   HGB 12.2 04/19/2013   HCT 36.1 04/19/2013   PLT 223 04/19/2013   GLUCOSE 98 04/19/2013   CHOL 178 08/16/2010   TRIG 36.0 08/16/2010   HDL 62.60 08/16/2010   LDLCALC 108* 08/16/2010   ALT 25 10/30/2008   AST 34 10/30/2008   NA 147 04/19/2013   K 4.1 04/19/2013   CL 111 04/19/2013   CREATININE 0.79 04/19/2013   BUN 19 04/19/2013   CO2 26 04/19/2013   TSH 18.667* 04/18/2013    ASSESSMENT AND PLAN:  Discussed the following assessment  and plan:  Hypothyroidism - Plan: TSH, CBC with Differential, Vit D  25 hydroxy (rtn osteoporosis monitoring), Basic metabolic panel  Memory problem - Plan: TSH, CBC with Differential, Vit D  25 hydroxy (rtn osteoporosis monitoring), Basic metabolic panel  AV block, 2nd degree presumed Mobitz with 2:1 conduction - Plan: TSH, CBC with Differential, Vit D  25 hydroxy (rtn osteoporosis monitoring), Basic metabolic panel  Dysuria - Plan: POC Urinalysis Dipstick, Urine culture, TSH, CBC with Differential, Vit D  25 hydroxy (rtn osteoporosis monitoring), Basic metabolic panel  Acute confusional state - Plan: POC Urinalysis Dipstick, Vit D  25 hydroxy (rtn osteoporosis monitoring), Basic metabolic panel  Unspecified vitamin D deficiency - Plan: Vit D  25 hydroxy (rtn osteoporosis monitoring), Basic metabolic panel  Hospital discharge follow-up  Perineal rash - seems to be like diaper rash and needs help wioth hygiene barrier cream paste.  unfortunately I can't really get a good history since she left the hospital. I have no records from either the rehabilitation or [facility] and the family can't tell me specifics although have a number of requests.  CMA call [facility] and they advised increased level of care to help with personal hygiene. If there was an ulcer than home health care referral. At this time I think local care may be helpful although if not getting better we may need to do a referral. But this is up to the family.  She is on 75 mcg of tthyroid replacement however it is unclear how often she's been taking it on a regular basis .  She apparently is off Calcitrol said she went in the hospital but I don't know why. We'll check a vitamin D level today.  Offered either neurology consult or trial of Aricept 5 mg prescription given to family members. They can discuss this with her power of attorney and decide whether to take the medicine.  Directed other questions about pacer checks  to the cardiology department apparently family has not been informed what to do in any written direction. And patient can't tell us. Either way recheck in 3 months.  Prolonged visit Total visit 59mins > 50% spent counseling and coordinating care    -Patient advised to return or notify health care team  if symptoms worsen ,persist or new concerns arise.  Patient Instructions  Will notify you  of labs when available.  Need to make sure  Thyroid is in range . Need  Daily help with cleaning bottom and use Caldesene  Daily to help heal the area.  May benefit from  Memory medication such as aricept  5 mg per day .  prescription given  Contact cardiology about  Pacer check follow up  And how this is arranged .  ROV in 3 months or as needed      Standley Brooking. Panosh M.D. Pre visit review using our clinic review tool, if applicable. No additional management support is needed unless otherwise documented below in the visit note.

## 2013-06-26 NOTE — Telephone Encounter (Signed)
Crystal with [facility] called to get a verbal order on UA for pt due to increased confusion. She states that yesterday they found pt near the road looking for a horse in a pasture.   Verbal order given and Crystal will fax order to be signed within 15 days

## 2013-06-27 ENCOUNTER — Telehealth: Payer: Self-pay | Admitting: Internal Medicine

## 2013-06-27 LAB — CBC WITH DIFFERENTIAL/PLATELET
BASOS ABS: 0.3 10*3/uL — AB (ref 0.0–0.1)
Basophils Relative: 3.1 % — ABNORMAL HIGH (ref 0.0–3.0)
Eosinophils Absolute: 0.2 10*3/uL (ref 0.0–0.7)
Eosinophils Relative: 2.2 % (ref 0.0–5.0)
HEMATOCRIT: 37 % (ref 36.0–46.0)
Hemoglobin: 12.3 g/dL (ref 12.0–15.0)
Lymphocytes Relative: 39.3 % (ref 12.0–46.0)
Lymphs Abs: 3.9 10*3/uL (ref 0.7–4.0)
MCHC: 33.1 g/dL (ref 30.0–36.0)
MCV: 93.3 fl (ref 78.0–100.0)
MONO ABS: 0.6 10*3/uL (ref 0.1–1.0)
Monocytes Relative: 5.8 % (ref 3.0–12.0)
NEUTROS ABS: 4.9 10*3/uL (ref 1.4–7.7)
Neutrophils Relative %: 49.6 % (ref 43.0–77.0)
PLATELETS: 199 10*3/uL (ref 150.0–400.0)
RBC: 3.97 Mil/uL (ref 3.87–5.11)
RDW: 13 % (ref 11.5–14.6)
WBC: 9.9 10*3/uL (ref 4.5–10.5)

## 2013-06-27 LAB — VITAMIN D 25 HYDROXY (VIT D DEFICIENCY, FRACTURES): VIT D 25 HYDROXY: 22 ng/mL — AB (ref 30–89)

## 2013-06-27 LAB — BASIC METABOLIC PANEL
BUN: 20 mg/dL (ref 6–23)
CO2: 28 mEq/L (ref 19–32)
CREATININE: 0.8 mg/dL (ref 0.4–1.2)
Calcium: 8.9 mg/dL (ref 8.4–10.5)
Chloride: 107 mEq/L (ref 96–112)
GFR: 67.86 mL/min (ref >60.00)
Glucose, Bld: 135 mg/dL — ABNORMAL HIGH (ref 70–99)
Potassium: 4.4 mEq/L (ref 3.5–5.1)
Sodium: 142 mEq/L (ref 135–145)

## 2013-06-27 LAB — TSH: TSH: 0.47 u[IU]/mL (ref 0.35–5.50)

## 2013-06-27 NOTE — Telephone Encounter (Signed)
Relevant patient education mailed to patient.  

## 2013-06-28 LAB — URINE CULTURE
Colony Count: NO GROWTH
Organism ID, Bacteria: NO GROWTH

## 2013-09-05 ENCOUNTER — Encounter: Payer: Medicare Other | Admitting: Cardiology

## 2013-09-11 ENCOUNTER — Encounter: Payer: Self-pay | Admitting: Internal Medicine

## 2013-09-11 ENCOUNTER — Ambulatory Visit (INDEPENDENT_AMBULATORY_CARE_PROVIDER_SITE_OTHER): Payer: Medicare Other | Admitting: Internal Medicine

## 2013-09-11 VITALS — BP 124/60 | HR 66 | Ht 65.0 in | Wt 122.0 lb

## 2013-09-11 DIAGNOSIS — R001 Bradycardia, unspecified: Secondary | ICD-10-CM

## 2013-09-11 DIAGNOSIS — I498 Other specified cardiac arrhythmias: Secondary | ICD-10-CM

## 2013-09-11 DIAGNOSIS — I441 Atrioventricular block, second degree: Secondary | ICD-10-CM

## 2013-09-11 LAB — MDC_IDC_ENUM_SESS_TYPE_INCLINIC
Battery Voltage: 3.04 V
Brady Statistic RV Percent Paced: 99.76 %
Implantable Pulse Generator Model: 2240
Lead Channel Pacing Threshold Amplitude: 0.5 V
Lead Channel Pacing Threshold Amplitude: 0.75 V
Lead Channel Pacing Threshold Pulse Width: 0.4 ms
Lead Channel Pacing Threshold Pulse Width: 0.4 ms
Lead Channel Sensing Intrinsic Amplitude: 11.5 mV
Lead Channel Setting Pacing Amplitude: 2 V
Lead Channel Setting Pacing Pulse Width: 0.4 ms
Lead Channel Setting Sensing Sensitivity: 2 mV
MDC IDC MSMT BATTERY REMAINING LONGEVITY: 135.6 mo
MDC IDC MSMT LEADCHNL RA IMPEDANCE VALUE: 537.5 Ohm
MDC IDC MSMT LEADCHNL RA PACING THRESHOLD AMPLITUDE: 0.5 V
MDC IDC MSMT LEADCHNL RA SENSING INTR AMPL: 5 mV
MDC IDC MSMT LEADCHNL RV IMPEDANCE VALUE: 687.5 Ohm
MDC IDC MSMT LEADCHNL RV PACING THRESHOLD PULSEWIDTH: 0.4 ms
MDC IDC PG SERIAL: 7585584
MDC IDC SESS DTM: 20150603151613
MDC IDC SET LEADCHNL RV PACING AMPLITUDE: 1 V
MDC IDC STAT BRADY RA PERCENT PACED: 0 %

## 2013-09-11 NOTE — Progress Notes (Signed)
PCP: Lottie Dawson, MD  Vanessa Keller is a 80 y.o. female who presents today for routine electrophysiology followup.  Since having her pacemaker implanted, the patient reports doing very well.  Today, she denies symptoms of palpitations, chest pain, shortness of breath,  lower extremity edema, dizziness, presyncope, or syncope.  The patient is otherwise without complaint today.   Past Medical History  Diagnosis Date  . Hypothyroidism   . Osteoporosis   . Urinary urgency   . Urinary incontinence   . Hearing loss     wears hearing aids  . Secondary hyperparathyroidism   . Fall 08/29/2010  . Second degree Mobitz II AV block   . Pacemaker 04/19/2013    [facility] Jude Medical Assurity DR  model [id number] (serial number  [id number] ) DUAL CHAMBER   Past Surgical History  Procedure Laterality Date  . Removal of carotid tumor  1997    left sx nonmalignant  . Insert / replace / remove pacemaker  04/19/2013    DUAL CHAMBER DR Caryl Comes  . Appendectomy    . Cataract extraction    . Permanent pacemaker insertion  Jan. 2015    [facility] Jude Medical Assurity DR  model [id number] (serial number  [id number] ) dual-lead pacemaker    Current Outpatient Prescriptions  Medication Sig Dispense Refill  . Cholecalciferol (VITAMIN D) 2000 UNITS tablet Take 2,000 Units by mouth daily.      Marland Kitchen donepezil (ARICEPT ODT) 5 MG disintegrating tablet Take 1 tablet (5 mg total) by mouth at bedtime.  30 tablet  5  . levothyroxine (SYNTHROID, LEVOTHROID) 75 MCG tablet Take 1 tablet (75 mcg total) by mouth daily before breakfast.  90 tablet  0   No current facility-administered medications for this visit.    Physical Exam: Filed Vitals:   09/11/13 1351  BP: 124/60  Pulse: 66  Height: 5\' 5"  (1.651 m)  Weight: 122 lb (55.339 kg)    GEN- The patient is well appearing, alert and oriented x 3 today.   Head- normocephalic, atraumatic Eyes-  Sclera clear, conjunctiva pink Ears- hearing intact Oropharynx- clear Lungs- Clear to  ausculation bilaterally, normal work of breathing Chest- pacemaker pocket is well healed Heart- Regular rate and rhythm, no murmurs, rubs or gallops, PMI not laterally displaced GI- soft, NT, ND, + BS Extremities- no clubbing, cyanosis, or edema  Pacemaker interrogation- reviewed in detail today,  See PACEART report ekg today reveals sinus rhythm with V pacing  Assessment and Plan:  1. Mobitz II second degree AV block Normal pacemaker function See Pace Art report No changes today  2. Atrial tachycardia Nonsustained and asymptomatic No changes today  [name] Return in 1 year

## 2013-09-11 NOTE — Patient Instructions (Addendum)
Remote monitoring is used to monitor your pacemaker from home. This monitoring reduces the number of office visits required to check your device to one time per year. It allows [place] to keep an eye on the functioning of your device to ensure it is working properly. You are scheduled for a device check from home on 12-17-2013. You may send your transmission at any time that day. If you have a wireless device, the transmission will be sent automatically. After your physician reviews your transmission, you will receive a postcard with your next transmission date.  Your physician recommends that you schedule a follow-up appointment in: January 2016 with Dr.Allred

## 2013-12-17 ENCOUNTER — Telehealth: Payer: Self-pay | Admitting: Cardiology

## 2013-12-17 ENCOUNTER — Encounter: Payer: Medicare Other | Admitting: *Deleted

## 2013-12-17 NOTE — Telephone Encounter (Signed)
LMOVM reminding pt to send remote transmission.   

## 2013-12-20 ENCOUNTER — Encounter: Payer: Self-pay | Admitting: Cardiology

## 2014-01-07 ENCOUNTER — Telehealth: Payer: Self-pay | Admitting: Internal Medicine

## 2014-01-07 NOTE — Telephone Encounter (Signed)
New problem:   Vanessa Keller called to set up and appointment to do a remote check.   Please give them a call back.   # [phone]

## 2014-01-07 NOTE — Telephone Encounter (Signed)
instructed pt nurse how to send manual transmission.

## 2014-01-10 ENCOUNTER — Telehealth: Payer: Self-pay | Admitting: Internal Medicine

## 2014-01-10 ENCOUNTER — Telehealth: Payer: Self-pay | Admitting: *Deleted

## 2014-01-10 NOTE — Telephone Encounter (Signed)
Spoke with pt nurse at nursing facility and she will call back when pt finishes her lunch.

## 2014-01-10 NOTE — Telephone Encounter (Signed)
Attempted to help nurse trouble shoot monitor. After several attempts we were unable to fix monitor. Instructed nurse to call tech services she verbalized understanding.

## 2014-01-10 NOTE — Telephone Encounter (Signed)
New message          Needs instructions for remote transmission

## 2014-01-10 NOTE — Telephone Encounter (Signed)
Pursina (sp?) from [facility] called asking for further assistance w/ [name] remote. Pursina called tech svcs who states the facility must use a dedicated phone line if sending via analog signal. Pursina unsure if pt was given a cell adapter. Pursina has two Merlins, neither appear to have an attachment that will go into the USB port. Pursina will continue to search for the adapter. She understands that if pt does not have adapter or analog line, remote will not work. She also understands that if unable to transmit, pt will need to be checked in office twice per year.

## 2014-01-30 ENCOUNTER — Telehealth: Payer: Self-pay | Admitting: Internal Medicine

## 2014-01-30 NOTE — Telephone Encounter (Signed)
Called facility and asked for [place]. Person who answered call said she was on break. I will call back later. Person who answered phone seemed very unsure.

## 2014-01-30 NOTE — Telephone Encounter (Signed)
New problem    Need to sched pt for a remote pacer ck. Please call

## 2014-01-30 NOTE — Telephone Encounter (Signed)
Left msg w/ Solmon Ice w/ my direct #.

## 2014-02-07 ENCOUNTER — Encounter: Payer: Self-pay | Admitting: *Deleted

## 2014-02-07 NOTE — Telephone Encounter (Signed)
Persina stated they have attempted connecting their [name] to dedicated fax lines and other methods in their facility, each w/out success. Persina attempted to trouble shoot with tech svcs but was unsuccessful. She needs a fax sent stating pt's device will need to be checked in office every six months instead of remote monitoring. I faxed a letter today.   ROV w/ Dr. Rayann Heman 05/05/14.

## 2014-02-18 ENCOUNTER — Encounter: Payer: Self-pay | Admitting: Cardiology

## 2014-03-20 ENCOUNTER — Encounter (HOSPITAL_COMMUNITY): Payer: Self-pay | Admitting: Internal Medicine

## 2014-04-11 DIAGNOSIS — W19XXXA Unspecified fall, initial encounter: Secondary | ICD-10-CM | POA: Diagnosis not present

## 2014-04-11 DIAGNOSIS — R279 Unspecified lack of coordination: Secondary | ICD-10-CM | POA: Diagnosis not present

## 2014-04-11 DIAGNOSIS — W19XXXD Unspecified fall, subsequent encounter: Secondary | ICD-10-CM | POA: Diagnosis not present

## 2014-04-14 DIAGNOSIS — W19XXXA Unspecified fall, initial encounter: Secondary | ICD-10-CM | POA: Diagnosis not present

## 2014-04-14 DIAGNOSIS — R279 Unspecified lack of coordination: Secondary | ICD-10-CM | POA: Diagnosis not present

## 2014-04-14 DIAGNOSIS — W19XXXD Unspecified fall, subsequent encounter: Secondary | ICD-10-CM | POA: Diagnosis not present

## 2014-04-16 DIAGNOSIS — W19XXXA Unspecified fall, initial encounter: Secondary | ICD-10-CM | POA: Diagnosis not present

## 2014-04-16 DIAGNOSIS — W19XXXD Unspecified fall, subsequent encounter: Secondary | ICD-10-CM | POA: Diagnosis not present

## 2014-04-16 DIAGNOSIS — R279 Unspecified lack of coordination: Secondary | ICD-10-CM | POA: Diagnosis not present

## 2014-04-18 DIAGNOSIS — E039 Hypothyroidism, unspecified: Secondary | ICD-10-CM | POA: Diagnosis not present

## 2014-04-18 DIAGNOSIS — D649 Anemia, unspecified: Secondary | ICD-10-CM | POA: Diagnosis not present

## 2014-04-18 DIAGNOSIS — W19XXXA Unspecified fall, initial encounter: Secondary | ICD-10-CM | POA: Diagnosis not present

## 2014-04-18 DIAGNOSIS — G301 Alzheimer's disease with late onset: Secondary | ICD-10-CM | POA: Diagnosis not present

## 2014-04-18 DIAGNOSIS — R279 Unspecified lack of coordination: Secondary | ICD-10-CM | POA: Diagnosis not present

## 2014-04-18 DIAGNOSIS — W19XXXD Unspecified fall, subsequent encounter: Secondary | ICD-10-CM | POA: Diagnosis not present

## 2014-04-21 DIAGNOSIS — W19XXXD Unspecified fall, subsequent encounter: Secondary | ICD-10-CM | POA: Diagnosis not present

## 2014-04-21 DIAGNOSIS — W19XXXA Unspecified fall, initial encounter: Secondary | ICD-10-CM | POA: Diagnosis not present

## 2014-04-21 DIAGNOSIS — R279 Unspecified lack of coordination: Secondary | ICD-10-CM | POA: Diagnosis not present

## 2014-04-22 DIAGNOSIS — Z79899 Other long term (current) drug therapy: Secondary | ICD-10-CM | POA: Diagnosis not present

## 2014-04-23 DIAGNOSIS — W19XXXA Unspecified fall, initial encounter: Secondary | ICD-10-CM | POA: Diagnosis not present

## 2014-04-23 DIAGNOSIS — R279 Unspecified lack of coordination: Secondary | ICD-10-CM | POA: Diagnosis not present

## 2014-04-23 DIAGNOSIS — W19XXXD Unspecified fall, subsequent encounter: Secondary | ICD-10-CM | POA: Diagnosis not present

## 2014-04-27 DIAGNOSIS — R279 Unspecified lack of coordination: Secondary | ICD-10-CM | POA: Diagnosis not present

## 2014-04-27 DIAGNOSIS — W19XXXA Unspecified fall, initial encounter: Secondary | ICD-10-CM | POA: Diagnosis not present

## 2014-04-27 DIAGNOSIS — W19XXXD Unspecified fall, subsequent encounter: Secondary | ICD-10-CM | POA: Diagnosis not present

## 2014-04-28 DIAGNOSIS — W19XXXD Unspecified fall, subsequent encounter: Secondary | ICD-10-CM | POA: Diagnosis not present

## 2014-04-28 DIAGNOSIS — W19XXXA Unspecified fall, initial encounter: Secondary | ICD-10-CM | POA: Diagnosis not present

## 2014-04-28 DIAGNOSIS — R279 Unspecified lack of coordination: Secondary | ICD-10-CM | POA: Diagnosis not present

## 2014-05-05 ENCOUNTER — Ambulatory Visit (INDEPENDENT_AMBULATORY_CARE_PROVIDER_SITE_OTHER): Payer: Medicare Other | Admitting: Internal Medicine

## 2014-05-05 ENCOUNTER — Encounter: Payer: Self-pay | Admitting: Internal Medicine

## 2014-05-05 VITALS — BP 104/60 | HR 67 | Ht 67.0 in | Wt 134.4 lb

## 2014-05-05 DIAGNOSIS — I441 Atrioventricular block, second degree: Secondary | ICD-10-CM | POA: Diagnosis not present

## 2014-05-05 DIAGNOSIS — I10 Essential (primary) hypertension: Secondary | ICD-10-CM

## 2014-05-05 DIAGNOSIS — R001 Bradycardia, unspecified: Secondary | ICD-10-CM | POA: Diagnosis not present

## 2014-05-05 LAB — MDC_IDC_ENUM_SESS_TYPE_INCLINIC
Brady Statistic RA Percent Paced: 0.06 %
Lead Channel Impedance Value: 650 Ohm
Lead Channel Pacing Threshold Amplitude: 0.5 V
Lead Channel Pacing Threshold Pulse Width: 0.4 ms
Lead Channel Pacing Threshold Pulse Width: 0.4 ms
Lead Channel Sensing Intrinsic Amplitude: 5 mV
Lead Channel Setting Pacing Pulse Width: 0.4 ms
Lead Channel Setting Sensing Sensitivity: 2 mV
MDC IDC MSMT BATTERY REMAINING LONGEVITY: 133.2 mo
MDC IDC MSMT BATTERY VOLTAGE: 3.02 V
MDC IDC MSMT LEADCHNL RA IMPEDANCE VALUE: 550 Ohm
MDC IDC MSMT LEADCHNL RA PACING THRESHOLD AMPLITUDE: 0.5 V
MDC IDC MSMT LEADCHNL RV SENSING INTR AMPL: 9.6 mV
MDC IDC PG SERIAL: 7585584
MDC IDC SESS DTM: 20160125115558
MDC IDC SET LEADCHNL RA PACING AMPLITUDE: 2 V
MDC IDC SET LEADCHNL RV PACING AMPLITUDE: 0.75 V
MDC IDC STAT BRADY RV PERCENT PACED: 99.94 %

## 2014-05-05 NOTE — Patient Instructions (Signed)
Your physician wants you to follow-up in: 12 months with Dr Allred You will receive a reminder letter in the mail two months in advance. If you don't receive a letter, please call our office to schedule the follow-up appointment.  

## 2014-05-05 NOTE — Progress Notes (Signed)
PCP: Lottie Dawson, MD  Vanessa Keller is a 81 y.o. female who presents today for routine electrophysiology followup.  Since her last visit, the patient reports doing very well.  Today, she denies symptoms of palpitations, chest pain, shortness of breath,  lower extremity edema, dizziness, presyncope, or syncope.  The patient is otherwise without complaint today.   Past Medical History  Diagnosis Date  . Hypothyroidism   . Osteoporosis   . Urinary urgency   . Urinary incontinence   . Hearing loss     wears hearing aids  . Secondary hyperparathyroidism   . Fall 08/29/2010  . Second degree Mobitz II AV block   . Pacemaker 04/19/2013    [facility] [employer] Assurity DR  model [id number] (serial number  [id number] ) DUAL CHAMBER   Past Surgical History  Procedure Laterality Date  . Removal of carotid tumor  1997    left sx nonmalignant  . Insert / replace / remove pacemaker  04/19/2013    DUAL CHAMBER DR Caryl Comes  . Appendectomy    . Cataract extraction    . Permanent pacemaker insertion  Jan. 2015    [facility] Assurity DR  model [id number] (serial number  [id number] ) dual-lead pacemaker  . Permanent pacemaker insertion N/A 04/19/2013    Procedure: PERMANENT PACEMAKER INSERTION;  Surgeon: Coralyn Mark, MD;  Location: [facility] CATH LAB;  Service: Cardiovascular;  Laterality: N/A;    Current Outpatient Prescriptions  Medication Sig Dispense Refill  . Cholecalciferol (VITAMIN D) 2000 UNITS tablet Take 2,000 Units by mouth daily.    Marland Kitchen donepezil (ARICEPT ODT) 5 MG disintegrating tablet Take 1 tablet (5 mg total) by mouth at bedtime. 30 tablet 5  . levothyroxine (SYNTHROID, LEVOTHROID) 75 MCG tablet Take 1 tablet (75 mcg total) by mouth daily before breakfast. 90 tablet 0   No current facility-administered medications for this visit.    Physical Exam: Filed Vitals:   05/05/14 1107  BP: 104/60  Pulse: 67  Height: 5\' 7"  (1.702 m)  Weight: 134 lb 6.4 oz (60.963 kg)    GEN- The patient is well  appearing, alert and oriented x 3 today.   Head- normocephalic, atraumatic Eyes-  Sclera clear, conjunctiva pink Ears- hearing intact Oropharynx- clear Lungs- Clear to ausculation bilaterally, normal work of breathing Chest- pacemaker pocket is well healed Heart- Regular rate and rhythm, no murmurs, rubs or gallops, PMI not laterally displaced GI- soft, NT, ND, + BS Extremities- no clubbing, cyanosis, or edema  Pacemaker interrogation- reviewed in detail today,  See PACEART report  Assessment and Plan:  1. Mobitz II second degree AV block Normal pacemaker function See Pace Art report No changes today  2. Atrial tachycardia Nonsustained and asymptomatic No changes today  [name] needs to be hooked up.  Will try to get her a cell adapter. Return in 1 year

## 2014-06-05 DIAGNOSIS — E039 Hypothyroidism, unspecified: Secondary | ICD-10-CM | POA: Diagnosis not present

## 2014-06-05 DIAGNOSIS — E119 Type 2 diabetes mellitus without complications: Secondary | ICD-10-CM | POA: Diagnosis not present

## 2014-07-02 DIAGNOSIS — E039 Hypothyroidism, unspecified: Secondary | ICD-10-CM | POA: Diagnosis not present

## 2014-07-02 DIAGNOSIS — G301 Alzheimer's disease with late onset: Secondary | ICD-10-CM | POA: Diagnosis not present

## 2014-07-02 DIAGNOSIS — I509 Heart failure, unspecified: Secondary | ICD-10-CM | POA: Diagnosis not present

## 2014-07-02 DIAGNOSIS — R7309 Other abnormal glucose: Secondary | ICD-10-CM | POA: Diagnosis not present

## 2014-07-02 DIAGNOSIS — D649 Anemia, unspecified: Secondary | ICD-10-CM | POA: Diagnosis not present

## 2014-07-03 DIAGNOSIS — E039 Hypothyroidism, unspecified: Secondary | ICD-10-CM | POA: Diagnosis not present

## 2014-07-03 DIAGNOSIS — D649 Anemia, unspecified: Secondary | ICD-10-CM | POA: Diagnosis not present

## 2014-07-16 DIAGNOSIS — I5022 Chronic systolic (congestive) heart failure: Secondary | ICD-10-CM | POA: Diagnosis not present

## 2014-07-16 DIAGNOSIS — G301 Alzheimer's disease with late onset: Secondary | ICD-10-CM | POA: Diagnosis not present

## 2014-07-16 DIAGNOSIS — E039 Hypothyroidism, unspecified: Secondary | ICD-10-CM | POA: Diagnosis not present

## 2014-08-04 ENCOUNTER — Telehealth: Payer: Self-pay | Admitting: Cardiology

## 2014-08-04 ENCOUNTER — Ambulatory Visit (INDEPENDENT_AMBULATORY_CARE_PROVIDER_SITE_OTHER): Payer: Medicare Other | Admitting: *Deleted

## 2014-08-04 ENCOUNTER — Encounter: Payer: Self-pay | Admitting: Internal Medicine

## 2014-08-04 DIAGNOSIS — I441 Atrioventricular block, second degree: Secondary | ICD-10-CM | POA: Diagnosis not present

## 2014-08-04 LAB — MDC_IDC_ENUM_SESS_TYPE_REMOTE
Battery Remaining Longevity: 133 mo
Battery Remaining Percentage: 95 %
Brady Statistic AP VS Percent: 1 %
Brady Statistic AS VP Percent: 99 %
Date Time Interrogation Session: 20160425175111
Implantable Pulse Generator Model: 2240
Lead Channel Impedance Value: 510 Ohm
Lead Channel Pacing Threshold Amplitude: 0.5 V
Lead Channel Pacing Threshold Pulse Width: 0.4 ms
Lead Channel Sensing Intrinsic Amplitude: 12 mV
Lead Channel Setting Pacing Amplitude: 0.75 V
Lead Channel Setting Pacing Amplitude: 2 V
MDC IDC MSMT BATTERY VOLTAGE: 3.02 V
MDC IDC MSMT LEADCHNL RA PACING THRESHOLD PULSEWIDTH: 0.4 ms
MDC IDC MSMT LEADCHNL RA SENSING INTR AMPL: 4.6 mV
MDC IDC MSMT LEADCHNL RV IMPEDANCE VALUE: 650 Ohm
MDC IDC MSMT LEADCHNL RV PACING THRESHOLD AMPLITUDE: 0.5 V
MDC IDC PG SERIAL: 7585584
MDC IDC SET LEADCHNL RV PACING PULSEWIDTH: 0.4 ms
MDC IDC SET LEADCHNL RV SENSING SENSITIVITY: 2 mV
MDC IDC STAT BRADY AP VP PERCENT: 1 %
MDC IDC STAT BRADY AS VS PERCENT: 1 %
MDC IDC STAT BRADY RA PERCENT PACED: 1 %
MDC IDC STAT BRADY RV PERCENT PACED: 99 %

## 2014-08-04 NOTE — Telephone Encounter (Signed)
LMOVM reminding pt to send remote transmission.   

## 2014-08-04 NOTE — Progress Notes (Signed)
Remote pacemaker transmission.   

## 2014-08-12 ENCOUNTER — Encounter: Payer: Self-pay | Admitting: Cardiology

## 2014-09-18 DIAGNOSIS — G301 Alzheimer's disease with late onset: Secondary | ICD-10-CM | POA: Diagnosis not present

## 2014-09-18 DIAGNOSIS — I509 Heart failure, unspecified: Secondary | ICD-10-CM | POA: Diagnosis not present

## 2014-10-06 DIAGNOSIS — S43014A Anterior dislocation of right humerus, initial encounter: Secondary | ICD-10-CM | POA: Diagnosis not present

## 2014-10-06 DIAGNOSIS — M25511 Pain in right shoulder: Secondary | ICD-10-CM | POA: Diagnosis not present

## 2014-10-06 DIAGNOSIS — S43004A Unspecified dislocation of right shoulder joint, initial encounter: Secondary | ICD-10-CM | POA: Diagnosis not present

## 2014-10-15 DIAGNOSIS — S43004D Unspecified dislocation of right shoulder joint, subsequent encounter: Secondary | ICD-10-CM | POA: Diagnosis not present

## 2014-10-20 DIAGNOSIS — E039 Hypothyroidism, unspecified: Secondary | ICD-10-CM | POA: Diagnosis not present

## 2014-10-20 DIAGNOSIS — I509 Heart failure, unspecified: Secondary | ICD-10-CM | POA: Diagnosis not present

## 2014-10-20 DIAGNOSIS — G301 Alzheimer's disease with late onset: Secondary | ICD-10-CM | POA: Diagnosis not present

## 2014-11-03 ENCOUNTER — Ambulatory Visit (INDEPENDENT_AMBULATORY_CARE_PROVIDER_SITE_OTHER): Payer: Medicare Other | Admitting: *Deleted

## 2014-11-03 DIAGNOSIS — I441 Atrioventricular block, second degree: Secondary | ICD-10-CM

## 2014-11-11 LAB — CUP PACEART REMOTE DEVICE CHECK
Battery Remaining Longevity: 142 mo
Battery Remaining Percentage: 95.5 %
Brady Statistic AP VS Percent: 1 %
Brady Statistic AS VP Percent: 99 %
Brady Statistic RA Percent Paced: 1 %
Brady Statistic RV Percent Paced: 99 %
Lead Channel Pacing Threshold Amplitude: 0.5 V
Lead Channel Pacing Threshold Pulse Width: 0.4 ms
Lead Channel Pacing Threshold Pulse Width: 0.4 ms
Lead Channel Sensing Intrinsic Amplitude: 12 mV
Lead Channel Setting Pacing Amplitude: 2 V
Lead Channel Setting Sensing Sensitivity: 2 mV
MDC IDC MSMT BATTERY VOLTAGE: 3.02 V
MDC IDC MSMT LEADCHNL RA IMPEDANCE VALUE: 530 Ohm
MDC IDC MSMT LEADCHNL RA PACING THRESHOLD AMPLITUDE: 0.5 V
MDC IDC MSMT LEADCHNL RA SENSING INTR AMPL: 4.7 mV
MDC IDC MSMT LEADCHNL RV IMPEDANCE VALUE: 690 Ohm
MDC IDC SESS DTM: 20160725060014
MDC IDC SET LEADCHNL RV PACING AMPLITUDE: 0.75 V
MDC IDC SET LEADCHNL RV PACING PULSEWIDTH: 0.4 ms
MDC IDC STAT BRADY AP VP PERCENT: 1 %
MDC IDC STAT BRADY AS VS PERCENT: 1 %
Pulse Gen Model: 2240
Pulse Gen Serial Number: 7585584

## 2014-11-11 NOTE — Progress Notes (Signed)
Pacemaker remote check. Device function reviewed. Impedance, sensing, auto capture thresholds consistent with previous measurements. Histograms appropriate for patient and level of activity. All other diagnostic data reviewed and is appropriate and stable for patient. Real time/magnet EGM shows appropriate sensing and capture. No ventricular high rate episodes. 37 mode swithc episodes, atrial tachycardia, all less than 5 minutes. Estimated longevity 11.8 years. Plan to follow in 3 months remotely, to see in office annually.

## 2014-11-12 DIAGNOSIS — S43004D Unspecified dislocation of right shoulder joint, subsequent encounter: Secondary | ICD-10-CM | POA: Diagnosis not present

## 2014-11-24 ENCOUNTER — Encounter: Payer: Self-pay | Admitting: Cardiology

## 2014-11-27 DIAGNOSIS — E039 Hypothyroidism, unspecified: Secondary | ICD-10-CM | POA: Diagnosis not present

## 2014-11-27 DIAGNOSIS — G301 Alzheimer's disease with late onset: Secondary | ICD-10-CM | POA: Diagnosis not present

## 2014-11-27 DIAGNOSIS — I509 Heart failure, unspecified: Secondary | ICD-10-CM | POA: Diagnosis not present

## 2014-11-28 ENCOUNTER — Encounter: Payer: Self-pay | Admitting: Internal Medicine

## 2014-12-01 DIAGNOSIS — S43004D Unspecified dislocation of right shoulder joint, subsequent encounter: Secondary | ICD-10-CM | POA: Diagnosis not present

## 2015-01-14 DIAGNOSIS — E039 Hypothyroidism, unspecified: Secondary | ICD-10-CM | POA: Diagnosis not present

## 2015-01-14 DIAGNOSIS — G301 Alzheimer's disease with late onset: Secondary | ICD-10-CM | POA: Diagnosis not present

## 2015-01-14 DIAGNOSIS — I509 Heart failure, unspecified: Secondary | ICD-10-CM | POA: Diagnosis not present

## 2015-02-08 IMAGING — CR DG CHEST 1V PORT
1 series · 1 of 1 positions shown · non-contrast
Comparison: Portable exam 2241 hr without priors for comparison

CLINICAL DATA: Irregular heartbeat

EXAM:
PORTABLE CHEST - 1 VIEW

[AP]
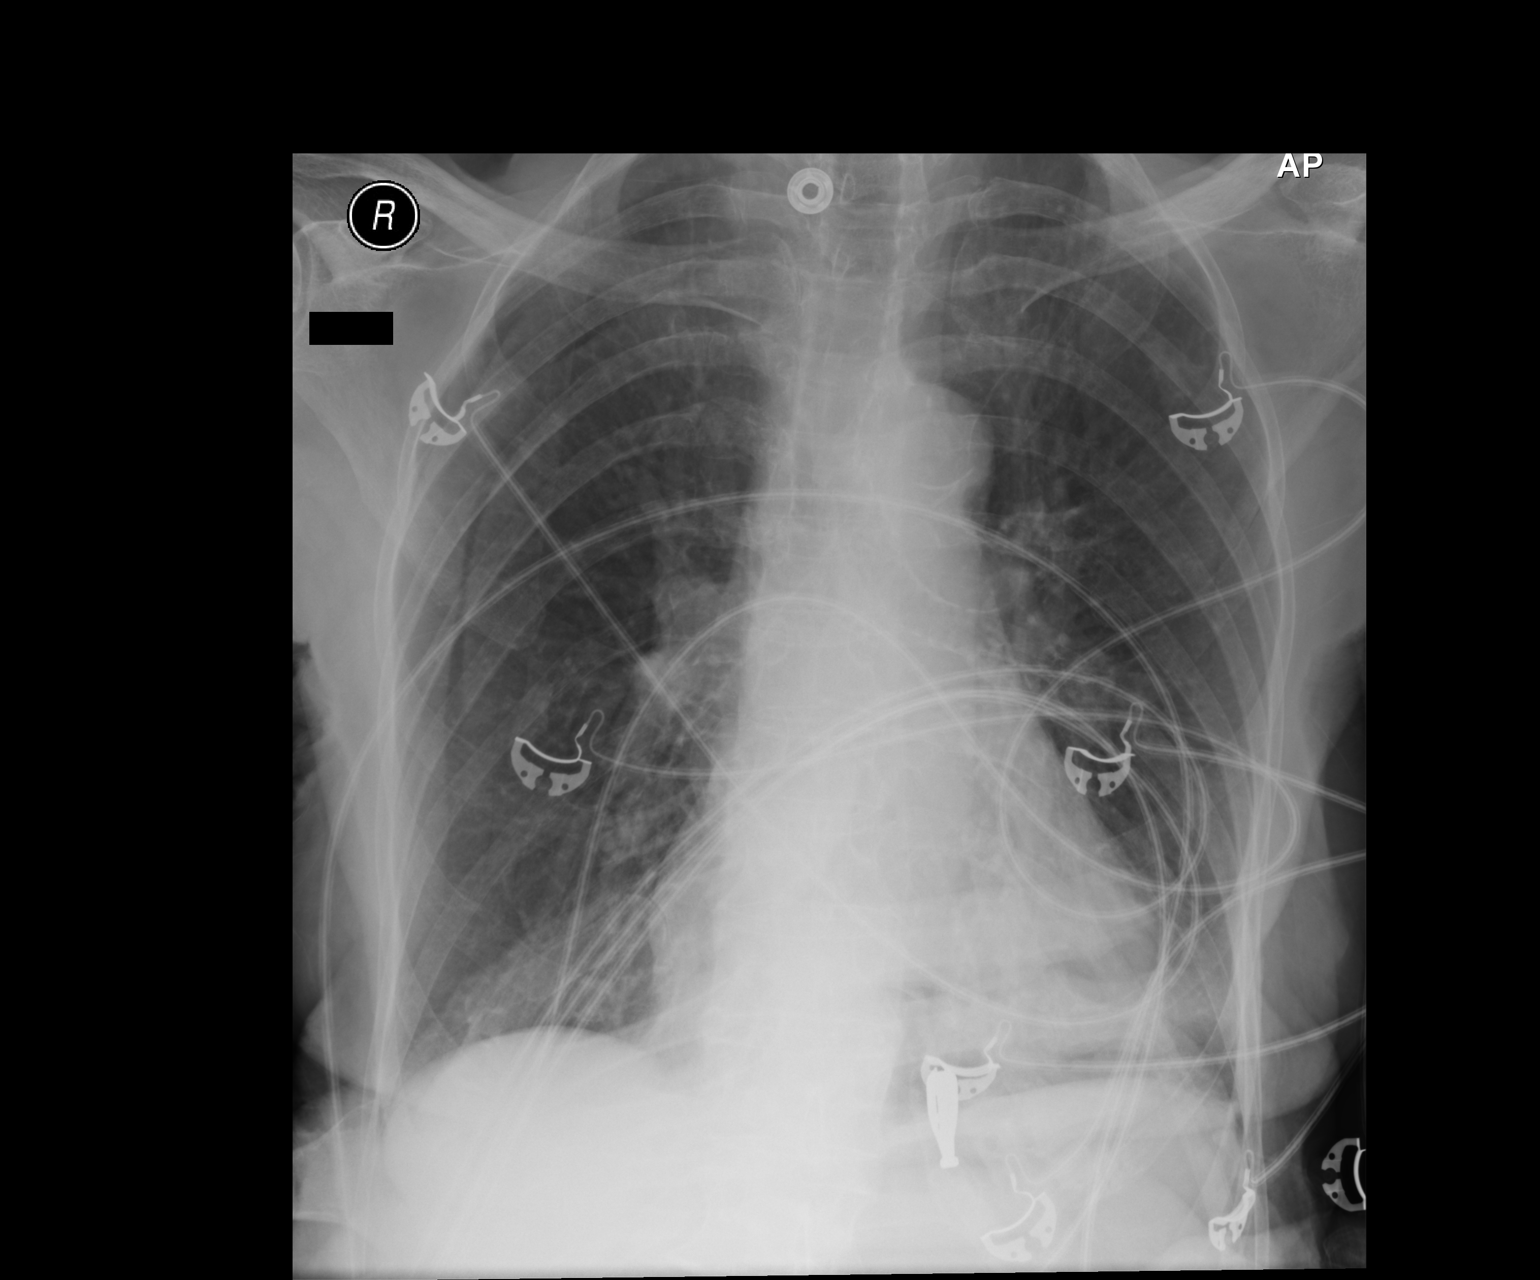

[1 of 1 positions shown; findings below may reference images not displayed]

FINDINGS: Patient's hand projects over inferior thorax.

Borderline enlargement of cardiac silhouette.

Atherosclerotic calcification aorta.

Mediastinal contours and pulmonary vascularity normal.

Lungs minimally hyperinflated but clear.

No pleural effusion or pneumothorax.

Bones diffusely demineralized.

Numerous EKG leads project over chest.
IMPRESSION: Borderline enlargement of cardiac silhouette.

No acute abnormalities.

## 2015-02-10 ENCOUNTER — Ambulatory Visit (INDEPENDENT_AMBULATORY_CARE_PROVIDER_SITE_OTHER): Payer: Medicare Other | Admitting: *Deleted

## 2015-02-10 DIAGNOSIS — I441 Atrioventricular block, second degree: Secondary | ICD-10-CM

## 2015-02-10 IMAGING — CR DG CHEST 2V
2 series · 2 of 2 positions shown · non-contrast
Comparison: 04/18/2013

CLINICAL DATA: Left subclavian pacer insertion

EXAM:
CHEST  2 VIEW

[w chest pa]
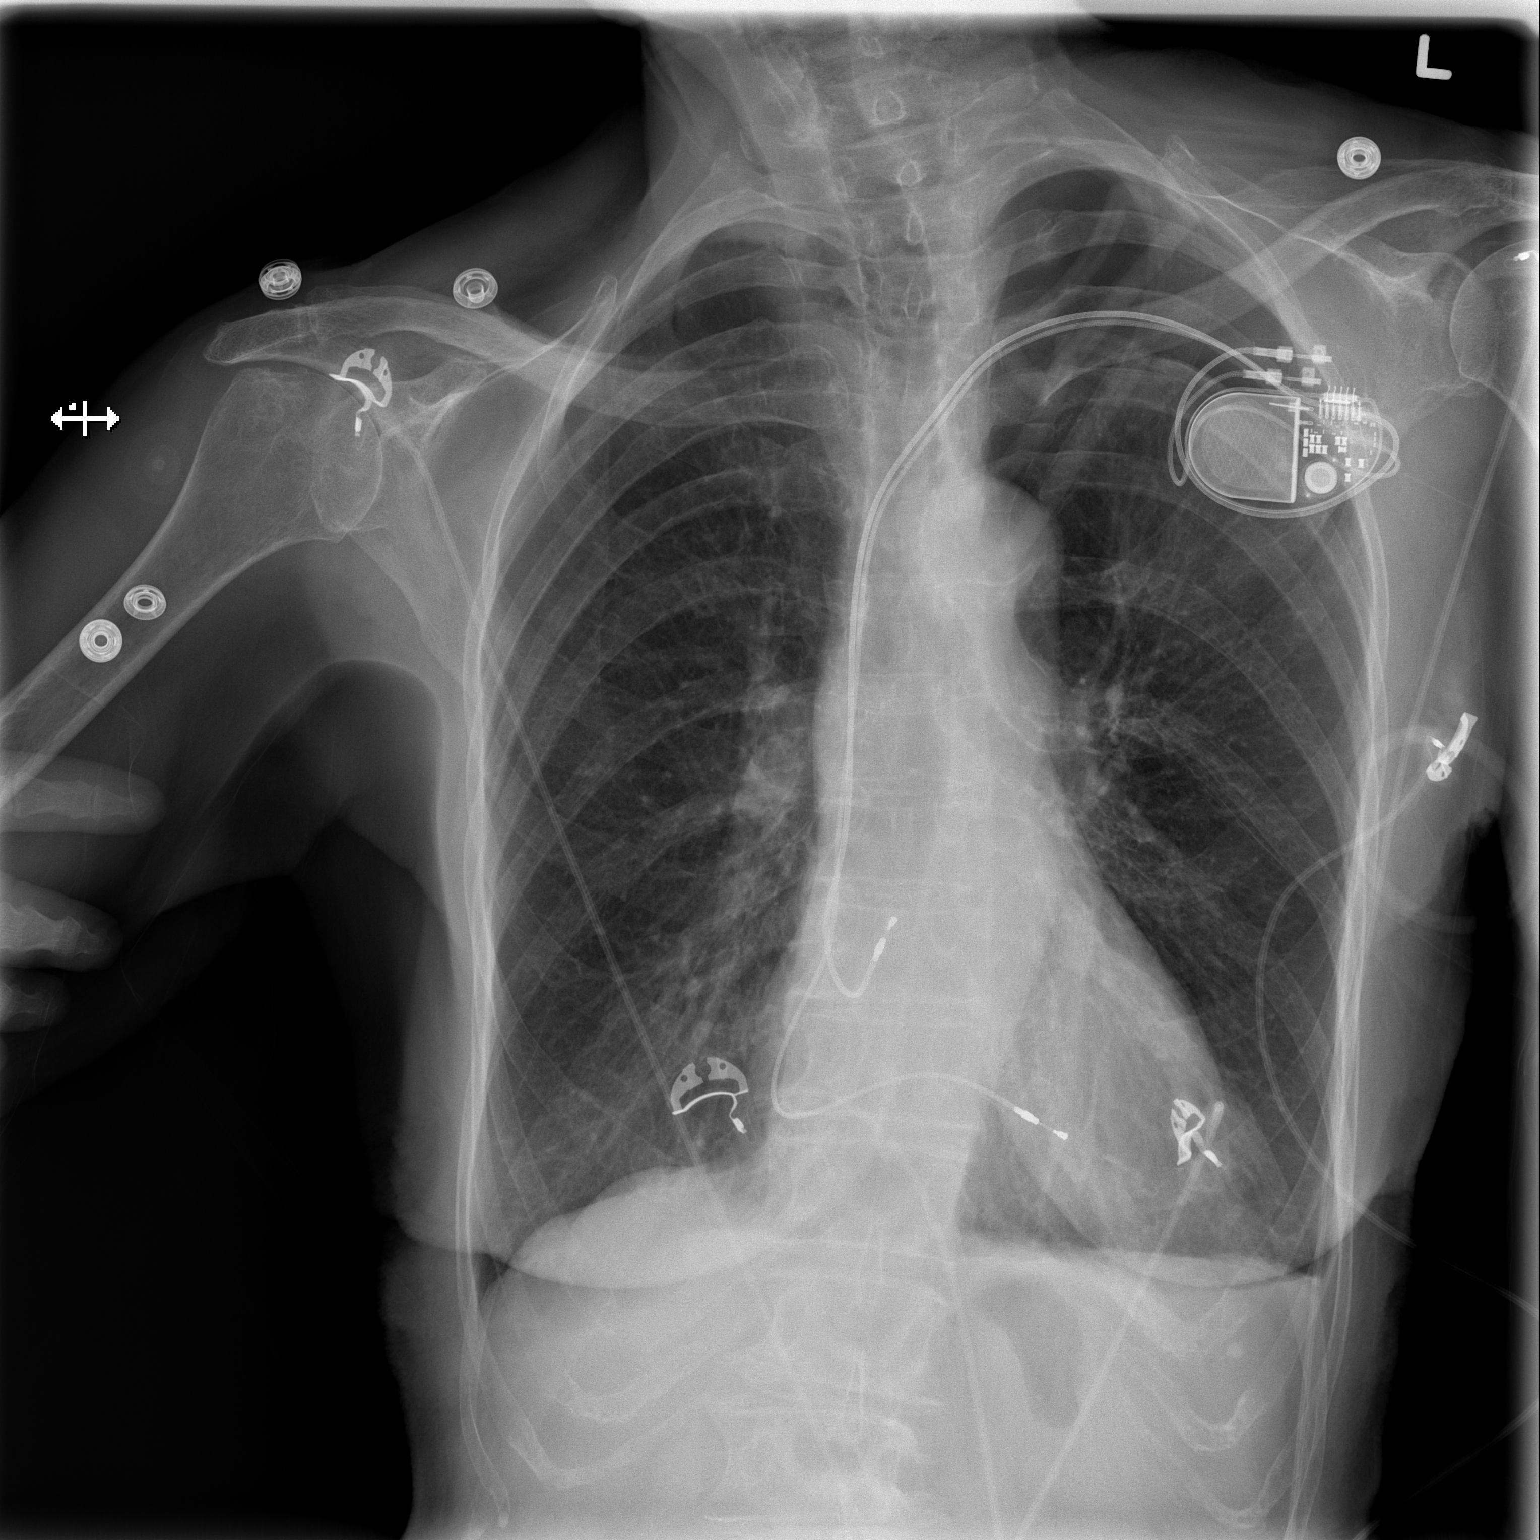

[w chest lat]
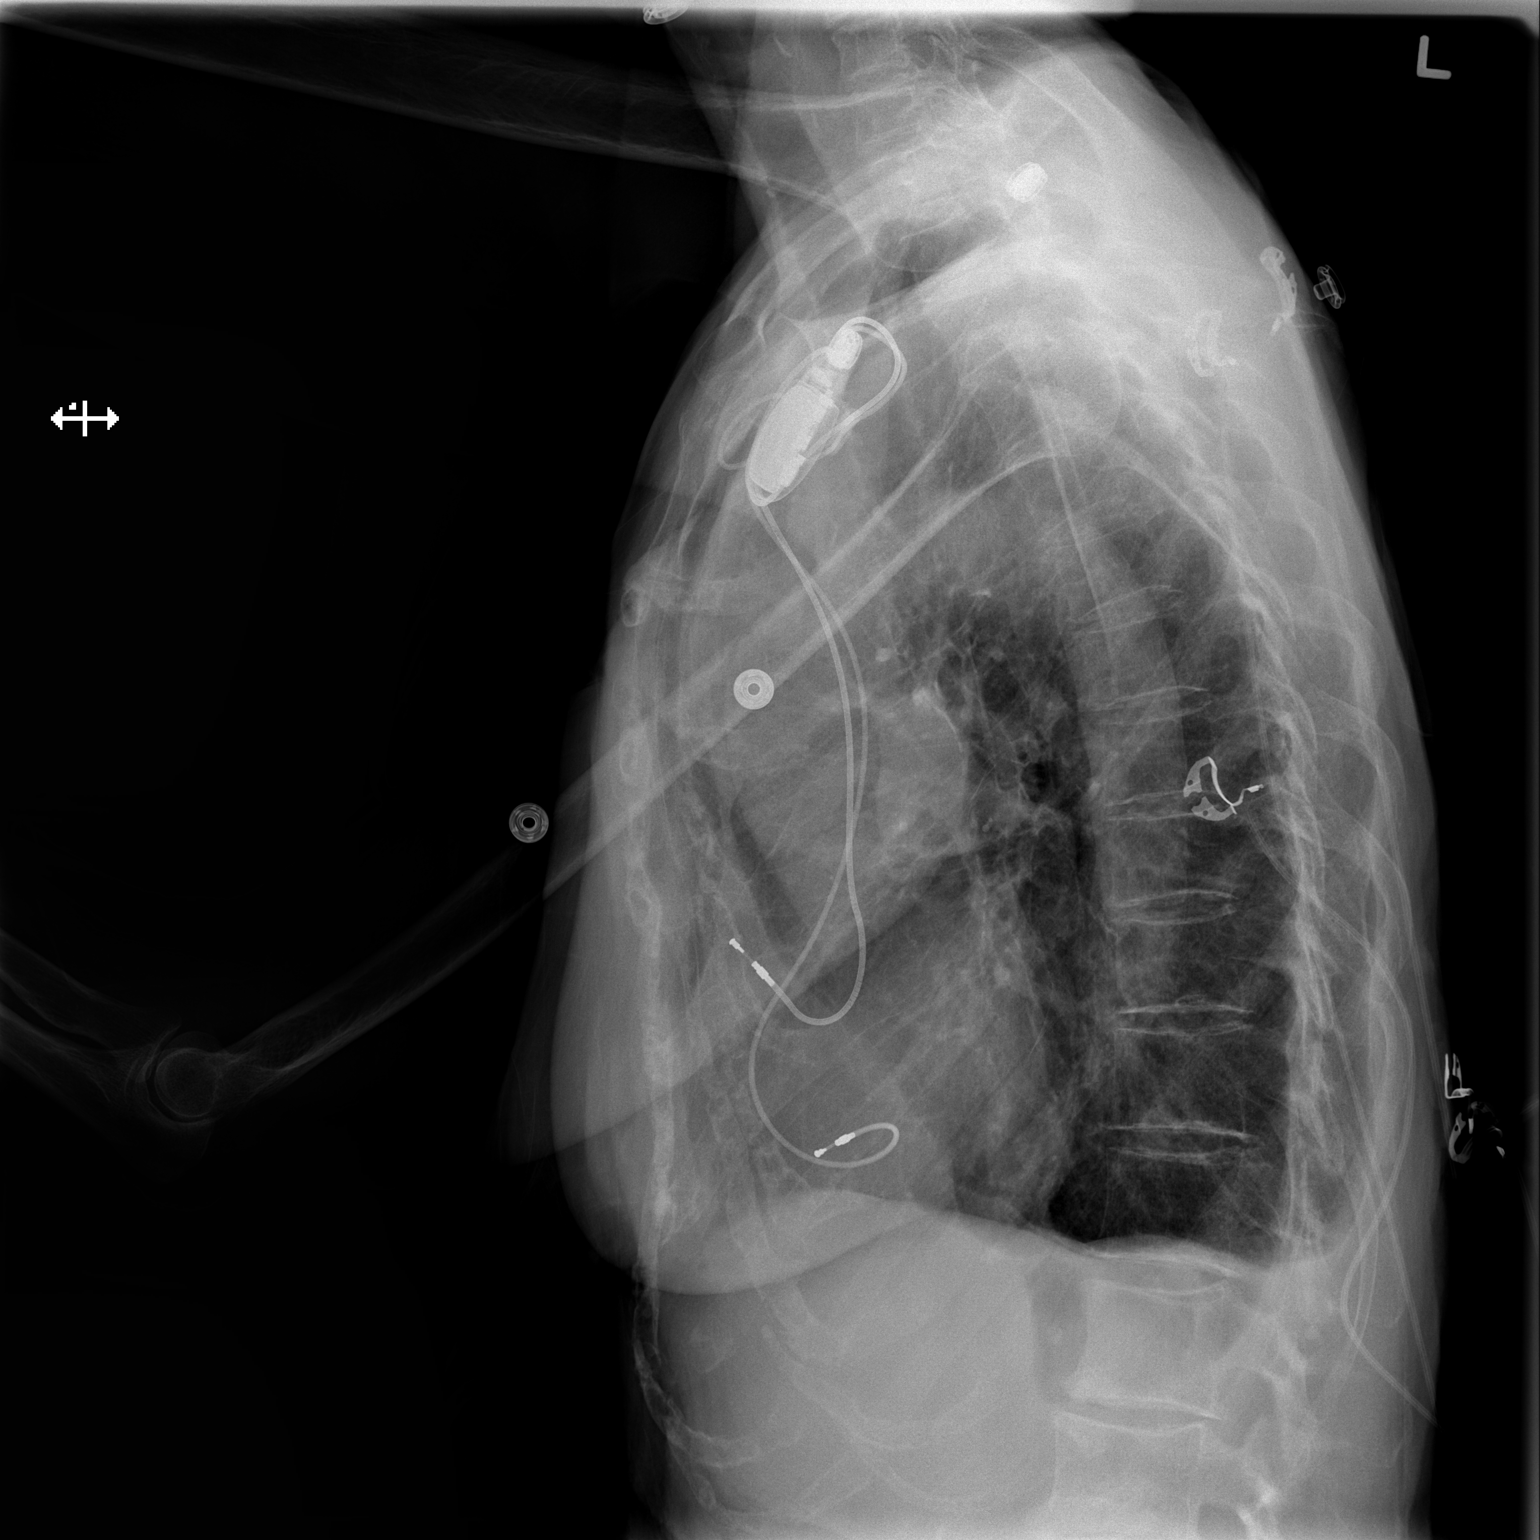

[2 of 2 positions shown; findings below may reference images not displayed]

FINDINGS: Left subclavian 2 lead pacer is been inserted. Small left apical
pneumothorax present, pleural separation roughly measures 3.3 cm.
Lungs are hyper inflated compatible with background COPD/ emphysema.
Mild cardiac enlargement without edema or CHF. No significant
effusion. Trachea is midline. Degenerative changes of the spine and
atherosclerosis of the aorta.
IMPRESSION: New left subclavian 2 lead pacer insertion.

Small left apical pneumothorax, 15- 20%.

Background COPD/emphysema

## 2015-02-10 IMAGING — CR DG CHEST 2V
2 series · 2 of 2 positions shown · non-contrast
Comparison: Film from earlier in the same day

CLINICAL DATA: Pneumothorax

EXAM:
CHEST  2 VIEW

[w chest pa]
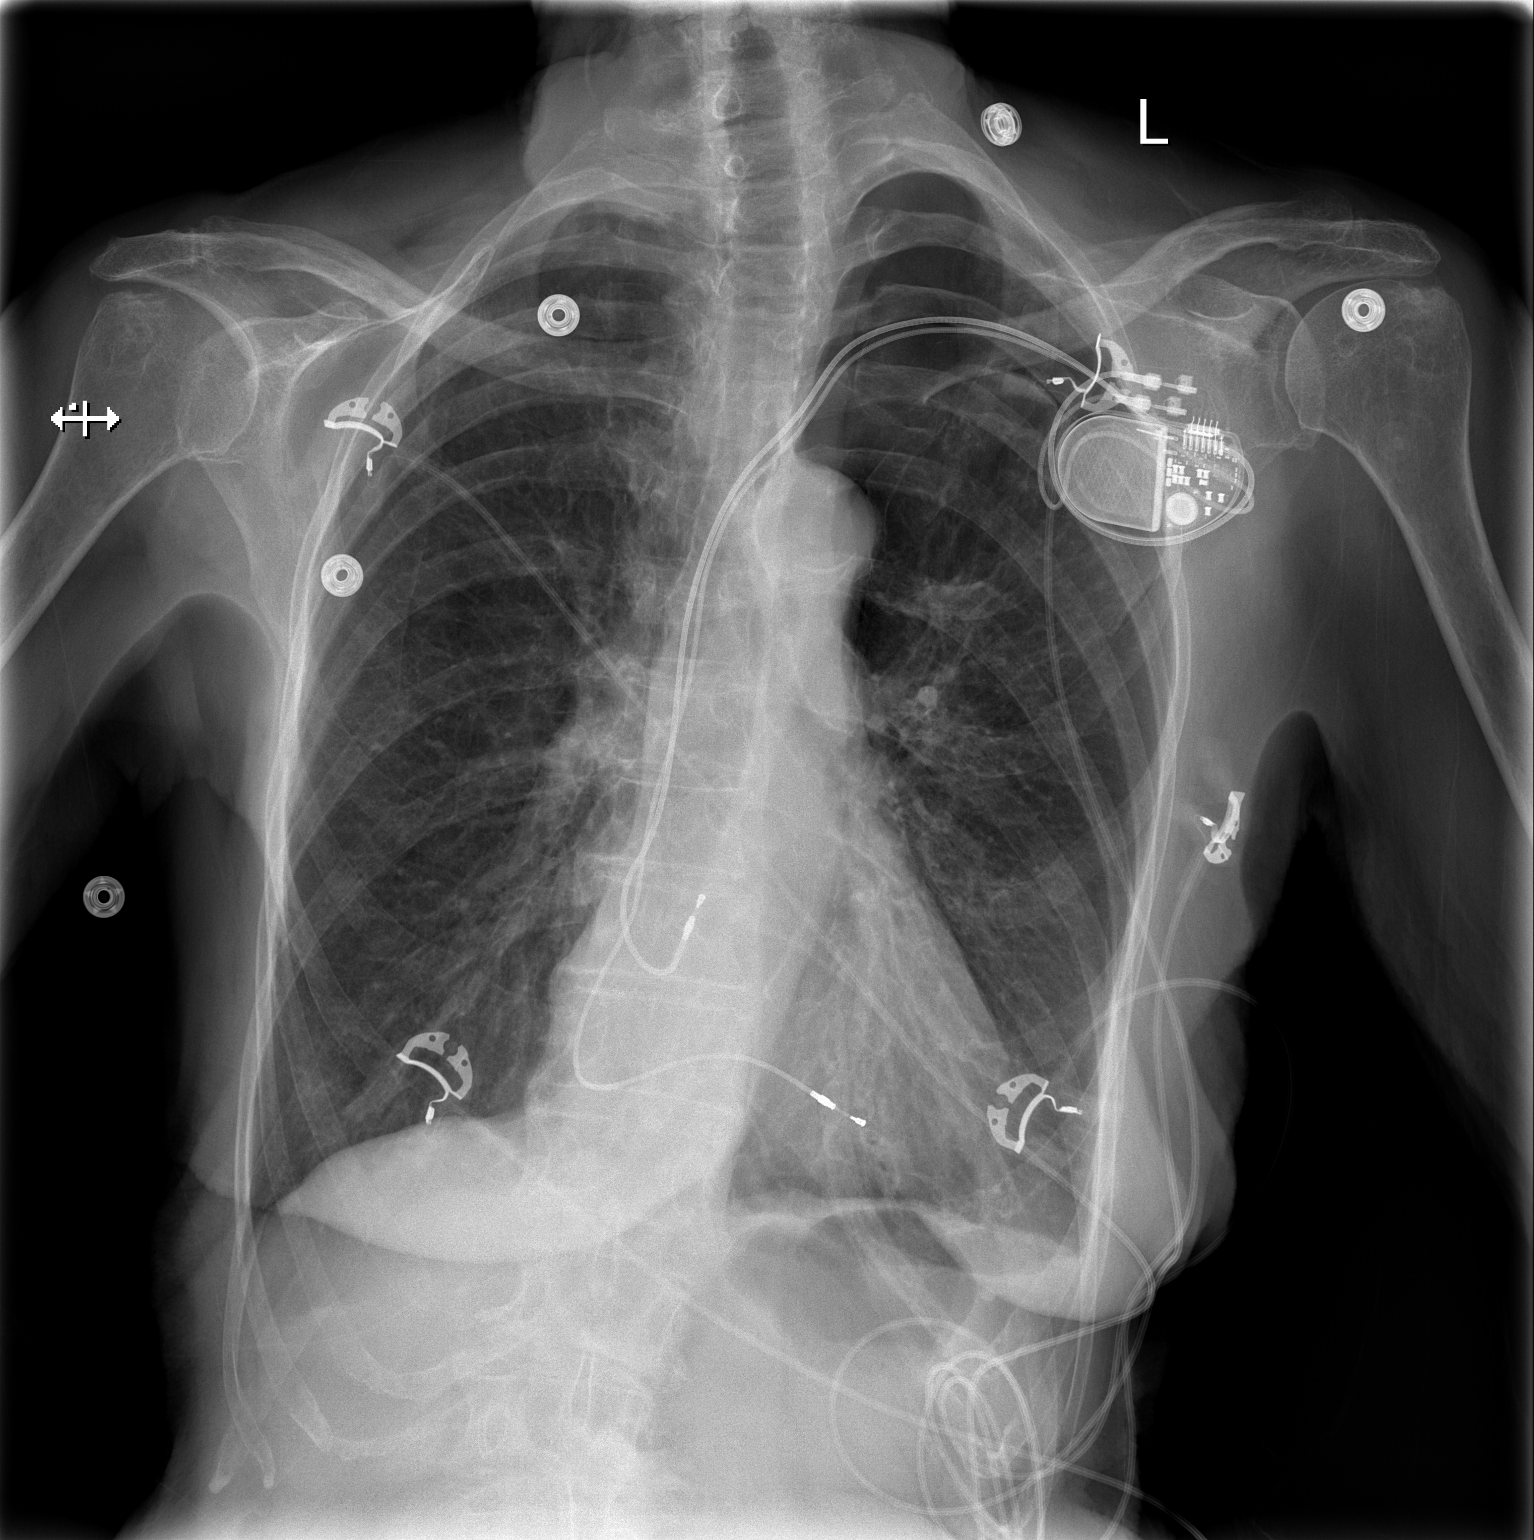

[w chest lat]
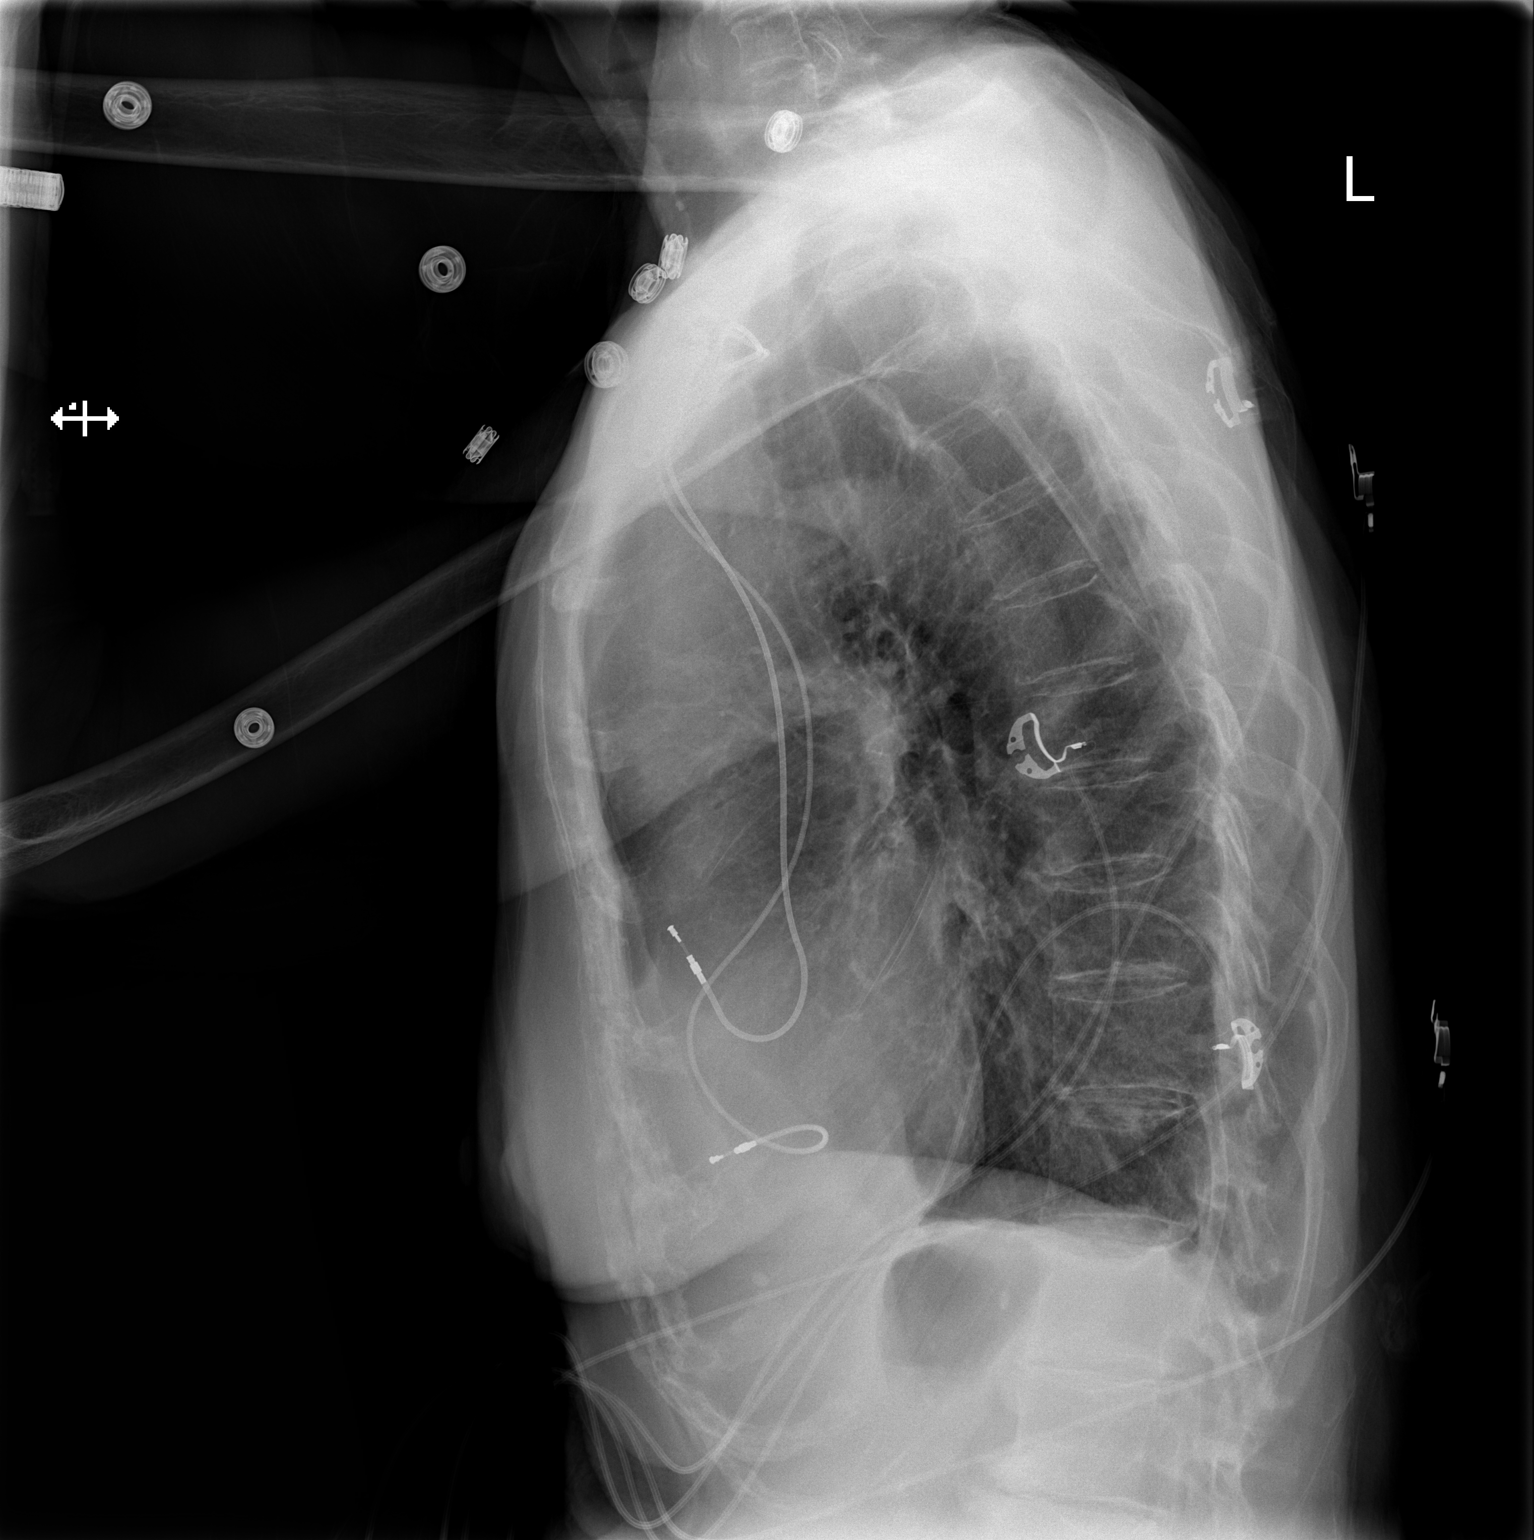

[2 of 2 positions shown; findings below may reference images not displayed]

FINDINGS: A left-sided pacing device is again identified. A left-sided
pneumothorax is again identified and appears slightly more prominent
in the medial aspect adjacent to the aortic knob. The excursion from
the apex is relatively stable. No focal infiltrate is seen. The
right lung is clear. The cardiac shadow is stable.
IMPRESSION: Slight increase in left-sided pneumothorax adjacent to the aortic
knob.

## 2015-02-11 NOTE — Progress Notes (Signed)
Remote pacemaker transmission.   

## 2015-02-26 LAB — CUP PACEART REMOTE DEVICE CHECK
Battery Remaining Longevity: 142 mo
Battery Remaining Percentage: 95.5 %
Brady Statistic AS VS Percent: 1 %
Brady Statistic RA Percent Paced: 1 %
Brady Statistic RV Percent Paced: 99 %
Date Time Interrogation Session: 20161101060015
Implantable Lead Implant Date: 20150109
Implantable Lead Location: 753860
Implantable Lead Model: 1948
Lead Channel Impedance Value: 580 Ohm
Lead Channel Pacing Threshold Amplitude: 0.5 V
Lead Channel Pacing Threshold Pulse Width: 0.4 ms
Lead Channel Sensing Intrinsic Amplitude: 5 mV
Lead Channel Setting Sensing Sensitivity: 2 mV
MDC IDC LEAD IMPLANT DT: 20150109
MDC IDC LEAD LOCATION: 753859
MDC IDC MSMT BATTERY VOLTAGE: 3.02 V
MDC IDC MSMT LEADCHNL RV IMPEDANCE VALUE: 730 Ohm
MDC IDC MSMT LEADCHNL RV PACING THRESHOLD AMPLITUDE: 0.5 V
MDC IDC MSMT LEADCHNL RV PACING THRESHOLD PULSEWIDTH: 0.4 ms
MDC IDC MSMT LEADCHNL RV SENSING INTR AMPL: 12 mV
MDC IDC PG SERIAL: 7585584
MDC IDC SET LEADCHNL RA PACING AMPLITUDE: 2 V
MDC IDC SET LEADCHNL RV PACING AMPLITUDE: 0.75 V
MDC IDC SET LEADCHNL RV PACING PULSEWIDTH: 0.4 ms
MDC IDC STAT BRADY AP VP PERCENT: 1 %
MDC IDC STAT BRADY AP VS PERCENT: 1 %
MDC IDC STAT BRADY AS VP PERCENT: 99 %
Pulse Gen Model: 2240

## 2015-02-27 ENCOUNTER — Encounter: Payer: Self-pay | Admitting: Cardiology

## 2015-03-18 DIAGNOSIS — G301 Alzheimer's disease with late onset: Secondary | ICD-10-CM | POA: Diagnosis not present

## 2015-03-18 DIAGNOSIS — E039 Hypothyroidism, unspecified: Secondary | ICD-10-CM | POA: Diagnosis not present

## 2015-03-18 DIAGNOSIS — I509 Heart failure, unspecified: Secondary | ICD-10-CM | POA: Diagnosis not present

## 2015-04-19 DIAGNOSIS — M545 Low back pain: Secondary | ICD-10-CM | POA: Diagnosis not present

## 2015-04-21 ENCOUNTER — Ambulatory Visit
Admission: RE | Admit: 2015-04-21 | Discharge: 2015-04-21 | Disposition: A | Discharge status: Home / Self Care | Payer: Medicare Other | Source: Ambulatory Visit | Attending: Geriatric Medicine | Admitting: Geriatric Medicine

## 2015-04-21 ENCOUNTER — Other Ambulatory Visit: Payer: Self-pay | Admitting: Geriatric Medicine

## 2015-04-21 DIAGNOSIS — R52 Pain, unspecified: Secondary | ICD-10-CM

## 2015-04-21 DIAGNOSIS — M5136 Other intervertebral disc degeneration, lumbar region: Secondary | ICD-10-CM | POA: Diagnosis not present

## 2015-04-22 DIAGNOSIS — R262 Difficulty in walking, not elsewhere classified: Secondary | ICD-10-CM | POA: Diagnosis not present

## 2015-04-22 DIAGNOSIS — M545 Low back pain: Secondary | ICD-10-CM | POA: Diagnosis not present

## 2015-04-23 DIAGNOSIS — M545 Low back pain: Secondary | ICD-10-CM | POA: Diagnosis not present

## 2015-04-23 DIAGNOSIS — R262 Difficulty in walking, not elsewhere classified: Secondary | ICD-10-CM | POA: Diagnosis not present

## 2015-04-23 DIAGNOSIS — S22080S Wedge compression fracture of T11-T12 vertebra, sequela: Secondary | ICD-10-CM | POA: Diagnosis not present

## 2015-04-24 DIAGNOSIS — R262 Difficulty in walking, not elsewhere classified: Secondary | ICD-10-CM | POA: Diagnosis not present

## 2015-04-24 DIAGNOSIS — M545 Low back pain: Secondary | ICD-10-CM | POA: Diagnosis not present

## 2015-04-27 DIAGNOSIS — M545 Low back pain: Secondary | ICD-10-CM | POA: Diagnosis not present

## 2015-04-27 DIAGNOSIS — R262 Difficulty in walking, not elsewhere classified: Secondary | ICD-10-CM | POA: Diagnosis not present

## 2015-04-28 DIAGNOSIS — R262 Difficulty in walking, not elsewhere classified: Secondary | ICD-10-CM | POA: Diagnosis not present

## 2015-04-28 DIAGNOSIS — M545 Low back pain: Secondary | ICD-10-CM | POA: Diagnosis not present

## 2015-04-29 DIAGNOSIS — R262 Difficulty in walking, not elsewhere classified: Secondary | ICD-10-CM | POA: Diagnosis not present

## 2015-04-29 DIAGNOSIS — M545 Low back pain: Secondary | ICD-10-CM | POA: Diagnosis not present

## 2015-04-30 DIAGNOSIS — R262 Difficulty in walking, not elsewhere classified: Secondary | ICD-10-CM | POA: Diagnosis not present

## 2015-04-30 DIAGNOSIS — M545 Low back pain: Secondary | ICD-10-CM | POA: Diagnosis not present

## 2015-05-01 DIAGNOSIS — R262 Difficulty in walking, not elsewhere classified: Secondary | ICD-10-CM | POA: Diagnosis not present

## 2015-05-01 DIAGNOSIS — M545 Low back pain: Secondary | ICD-10-CM | POA: Diagnosis not present

## 2015-05-04 DIAGNOSIS — R262 Difficulty in walking, not elsewhere classified: Secondary | ICD-10-CM | POA: Diagnosis not present

## 2015-05-04 DIAGNOSIS — M545 Low back pain: Secondary | ICD-10-CM | POA: Diagnosis not present

## 2015-05-05 DIAGNOSIS — M545 Low back pain: Secondary | ICD-10-CM | POA: Diagnosis not present

## 2015-05-05 DIAGNOSIS — R262 Difficulty in walking, not elsewhere classified: Secondary | ICD-10-CM | POA: Diagnosis not present

## 2015-05-06 ENCOUNTER — Other Ambulatory Visit: Payer: Self-pay

## 2015-05-06 ENCOUNTER — Ambulatory Visit (INDEPENDENT_AMBULATORY_CARE_PROVIDER_SITE_OTHER): Payer: Medicare Other | Admitting: Internal Medicine

## 2015-05-06 ENCOUNTER — Encounter: Payer: Self-pay | Admitting: Internal Medicine

## 2015-05-06 VITALS — BP 112/60 | HR 76 | Ht 67.0 in | Wt 124.0 lb

## 2015-05-06 DIAGNOSIS — I10 Essential (primary) hypertension: Secondary | ICD-10-CM | POA: Diagnosis not present

## 2015-05-06 DIAGNOSIS — I441 Atrioventricular block, second degree: Secondary | ICD-10-CM | POA: Diagnosis not present

## 2015-05-06 DIAGNOSIS — R262 Difficulty in walking, not elsewhere classified: Secondary | ICD-10-CM | POA: Diagnosis not present

## 2015-05-06 DIAGNOSIS — M545 Low back pain: Secondary | ICD-10-CM | POA: Diagnosis not present

## 2015-05-06 LAB — CUP PACEART INCLINIC DEVICE CHECK
Battery Remaining Longevity: 141.6
Brady Statistic RA Percent Paced: 0.67 %
Date Time Interrogation Session: 20170125112319
Implantable Lead Location: 753860
Implantable Lead Model: 1948
Lead Channel Impedance Value: 675 Ohm
Lead Channel Pacing Threshold Amplitude: 0.5 V
Lead Channel Pacing Threshold Pulse Width: 0.4 ms
Lead Channel Pacing Threshold Pulse Width: 0.4 ms
Lead Channel Pacing Threshold Pulse Width: 0.4 ms
Lead Channel Pacing Threshold Pulse Width: 0.4 ms
Lead Channel Sensing Intrinsic Amplitude: 5 mV
Lead Channel Setting Sensing Sensitivity: 2 mV
MDC IDC LEAD IMPLANT DT: 20150109
MDC IDC LEAD IMPLANT DT: 20150109
MDC IDC LEAD LOCATION: 753859
MDC IDC MSMT BATTERY VOLTAGE: 3.02 V
MDC IDC MSMT LEADCHNL RA IMPEDANCE VALUE: 550 Ohm
MDC IDC MSMT LEADCHNL RA PACING THRESHOLD AMPLITUDE: 0.5 V
MDC IDC MSMT LEADCHNL RV PACING THRESHOLD AMPLITUDE: 1 V
MDC IDC MSMT LEADCHNL RV PACING THRESHOLD AMPLITUDE: 1 V
MDC IDC MSMT LEADCHNL RV SENSING INTR AMPL: 12 mV
MDC IDC PG SERIAL: 7585584
MDC IDC SET LEADCHNL RA PACING AMPLITUDE: 2 V
MDC IDC SET LEADCHNL RV PACING AMPLITUDE: 0.75 V
MDC IDC SET LEADCHNL RV PACING PULSEWIDTH: 0.4 ms
MDC IDC STAT BRADY RV PERCENT PACED: 99.96 %
Pulse Gen Model: 2240

## 2015-05-06 NOTE — Progress Notes (Signed)
PCP: Lottie Dawson, MD  Vanessa Keller is a 82 y.o. female who presents today for routine electrophysiology followup.  Since her last visit, the patient reports doing well. She appears to have mild dementia. Today, she denies symptoms of palpitations, chest pain, shortness of breath,  lower extremity edema, dizziness, presyncope, or syncope.  The patient is otherwise without complaint today.   Past Medical History  Diagnosis Date  . Hypothyroidism   . Osteoporosis   . Urinary urgency   . Urinary incontinence   . Hearing loss     wears hearing aids  . Secondary hyperparathyroidism ([place])   . Fall 08/29/2010  . Second degree Mobitz II AV block   . Pacemaker 04/19/2013    [facility] Jude Medical Assurity DR  model [id number] (serial number  [id number] ) DUAL CHAMBER   Past Surgical History  Procedure Laterality Date  . Removal of carotid tumor  1997    left sx nonmalignant  . Insert / replace / remove pacemaker  04/19/2013    DUAL CHAMBER DR Caryl Comes  . Appendectomy    . Cataract extraction    . Permanent pacemaker insertion  Jan. 2015    [facility] Assurity DR  model [id number] [id number] (serial number  [id number] ) dual-lead pacemaker  . Permanent pacemaker insertion N/A 04/19/2013    Procedure: PERMANENT PACEMAKER INSERTION;  Surgeon: Coralyn Mark, MD;  Location: [facility] CATH LAB;  Service: Cardiovascular;  Laterality: N/A;    Current Outpatient Prescriptions  Medication Sig Dispense Refill  . Cholecalciferol (VITAMIN D) 2000 UNITS tablet Take 2,000 Units by mouth daily.    Marland Kitchen donepezil (ARICEPT ODT) 5 MG disintegrating tablet Take 1 tablet (5 mg total) by mouth at bedtime. 30 tablet 5  . HYDROcodone-acetaminophen (NORCO/VICODIN) 5-325 MG tablet Take 2 tablets by mouth every 6 (six) hours as needed. pain    . levothyroxine (SYNTHROID, LEVOTHROID) 75 MCG tablet Take 1 tablet (75 mcg total) by mouth daily before breakfast. 90 tablet 0  . oxyCODONE (OXYCONTIN) 10 mg 12 hr tablet Take 10 mg by mouth every morning.      No current facility-administered medications for this visit.    Physical Exam: Filed Vitals:   05/06/15 1108  BP: 112/60  Pulse: 76  Height: 5\' 7"  (1.702 m)  Weight: 124 lb (56.246 kg)    GEN- The patient is well appearing, alert and oriented x 3 today.   Head- normocephalic, atraumatic Eyes-  Sclera clear, conjunctiva pink Ears- hearing intact Oropharynx- clear Lungs- Clear to ausculation bilaterally, normal work of breathing Chest- pacemaker pocket is well healed Heart- Regular rate and rhythm, no murmurs, rubs or gallops, PMI not laterally displaced GI- soft, NT, ND, + BS Extremities- no clubbing, cyanosis, or edema  Pacemaker interrogation- reviewed in detail today,  See PACEART report  Assessment and Plan:  1. Mobitz II second degree AV block Normal pacemaker function See Pace Art report No changes today  2. Atrial tachycardia rare and asymptomatic No changes today  3. HTN Stable No change required today  [name] Return in 1 year to see EP NP  Thompson Grayer MD, [facility] 05/06/2015 11:22 AM

## 2015-05-06 NOTE — Patient Instructions (Signed)
Medication Instructions:  Your physician recommends that you continue on your current medications as directed. Please refer to the Current Medication list given to you today.   Labwork: None ordered   Testing/Procedures: None ordered   Follow-Up: Your physician wants you to follow-up in: 12 months with Chanetta Marshall, NP. in the mail two months in advance. If you don't receive a letter, please call our office to schedule the follow-up appointment.  Remote monitoring is used to monitor your Pacemaker from home. This monitoring reduces the number of office visits required to check your device to one time per year. It allows [place] to keep an eye on the functioning of your device to ensure it is working properly. You are scheduled for a device check from home on 08/05/15. You may send your transmission at any time that day. If you have a wireless device, the transmission will be sent automatically. After your physician reviews your transmission, you will receive a postcard with your next transmission date.     Any Other Special Instructions Will Be Listed Below (If Applicable).     If you need a refill on your cardiac medications before your next appointment, please call your pharmacy.

## 2015-05-07 DIAGNOSIS — R262 Difficulty in walking, not elsewhere classified: Secondary | ICD-10-CM | POA: Diagnosis not present

## 2015-05-07 DIAGNOSIS — M545 Low back pain: Secondary | ICD-10-CM | POA: Diagnosis not present

## 2015-05-08 DIAGNOSIS — M545 Low back pain: Secondary | ICD-10-CM | POA: Diagnosis not present

## 2015-05-08 DIAGNOSIS — R262 Difficulty in walking, not elsewhere classified: Secondary | ICD-10-CM | POA: Diagnosis not present

## 2015-05-11 DIAGNOSIS — M545 Low back pain: Secondary | ICD-10-CM | POA: Diagnosis not present

## 2015-05-11 DIAGNOSIS — R262 Difficulty in walking, not elsewhere classified: Secondary | ICD-10-CM | POA: Diagnosis not present

## 2015-05-12 DIAGNOSIS — R262 Difficulty in walking, not elsewhere classified: Secondary | ICD-10-CM | POA: Diagnosis not present

## 2015-05-12 DIAGNOSIS — M545 Low back pain: Secondary | ICD-10-CM | POA: Diagnosis not present

## 2015-05-13 DIAGNOSIS — R262 Difficulty in walking, not elsewhere classified: Secondary | ICD-10-CM | POA: Diagnosis not present

## 2015-05-13 DIAGNOSIS — M545 Low back pain: Secondary | ICD-10-CM | POA: Diagnosis not present

## 2015-05-14 DIAGNOSIS — M545 Low back pain: Secondary | ICD-10-CM | POA: Diagnosis not present

## 2015-05-14 DIAGNOSIS — R262 Difficulty in walking, not elsewhere classified: Secondary | ICD-10-CM | POA: Diagnosis not present

## 2015-05-18 DIAGNOSIS — R262 Difficulty in walking, not elsewhere classified: Secondary | ICD-10-CM | POA: Diagnosis not present

## 2015-05-18 DIAGNOSIS — M545 Low back pain: Secondary | ICD-10-CM | POA: Diagnosis not present

## 2015-06-10 DIAGNOSIS — N390 Urinary tract infection, site not specified: Secondary | ICD-10-CM | POA: Diagnosis not present

## 2015-06-15 DIAGNOSIS — M549 Dorsalgia, unspecified: Secondary | ICD-10-CM | POA: Diagnosis not present

## 2015-06-15 DIAGNOSIS — E039 Hypothyroidism, unspecified: Secondary | ICD-10-CM | POA: Diagnosis not present

## 2015-06-15 DIAGNOSIS — G301 Alzheimer's disease with late onset: Secondary | ICD-10-CM | POA: Diagnosis not present

## 2015-06-15 DIAGNOSIS — I509 Heart failure, unspecified: Secondary | ICD-10-CM | POA: Diagnosis not present

## 2015-06-16 DIAGNOSIS — E039 Hypothyroidism, unspecified: Secondary | ICD-10-CM | POA: Diagnosis not present

## 2015-08-05 ENCOUNTER — Ambulatory Visit (INDEPENDENT_AMBULATORY_CARE_PROVIDER_SITE_OTHER): Payer: Medicare Other | Admitting: *Deleted

## 2015-08-05 DIAGNOSIS — I441 Atrioventricular block, second degree: Secondary | ICD-10-CM | POA: Diagnosis not present

## 2015-08-05 NOTE — Progress Notes (Signed)
Remote pacemaker transmission.   

## 2015-08-13 DIAGNOSIS — E039 Hypothyroidism, unspecified: Secondary | ICD-10-CM | POA: Diagnosis not present

## 2015-08-13 DIAGNOSIS — I509 Heart failure, unspecified: Secondary | ICD-10-CM | POA: Diagnosis not present

## 2015-08-13 DIAGNOSIS — G301 Alzheimer's disease with late onset: Secondary | ICD-10-CM | POA: Diagnosis not present

## 2015-09-16 ENCOUNTER — Encounter: Payer: Self-pay | Admitting: Cardiology

## 2015-09-16 LAB — CUP PACEART REMOTE DEVICE CHECK
Brady Statistic AS VP Percent: 99 %
Brady Statistic RA Percent Paced: 1 %
Implantable Lead Implant Date: 20150109
Implantable Lead Location: 753859
Implantable Lead Location: 753860
Implantable Lead Model: 1948
Lead Channel Pacing Threshold Amplitude: 0.5 V
Lead Channel Pacing Threshold Pulse Width: 0.4 ms
Lead Channel Sensing Intrinsic Amplitude: 10.6 mV
Lead Channel Setting Pacing Amplitude: 0.75 V
Lead Channel Setting Pacing Amplitude: 2 V
Lead Channel Setting Pacing Pulse Width: 0.4 ms
MDC IDC LEAD IMPLANT DT: 20150109
MDC IDC MSMT BATTERY REMAINING LONGEVITY: 142 mo
MDC IDC MSMT BATTERY REMAINING PERCENTAGE: 95.5 %
MDC IDC MSMT BATTERY VOLTAGE: 3.02 V
MDC IDC MSMT LEADCHNL RA IMPEDANCE VALUE: 540 Ohm
MDC IDC MSMT LEADCHNL RA PACING THRESHOLD AMPLITUDE: 0.5 V
MDC IDC MSMT LEADCHNL RA SENSING INTR AMPL: 5 mV
MDC IDC MSMT LEADCHNL RV IMPEDANCE VALUE: 690 Ohm
MDC IDC MSMT LEADCHNL RV PACING THRESHOLD PULSEWIDTH: 0.4 ms
MDC IDC SESS DTM: 20170426080018
MDC IDC SET LEADCHNL RV SENSING SENSITIVITY: 2 mV
MDC IDC STAT BRADY AP VP PERCENT: 1 %
MDC IDC STAT BRADY AP VS PERCENT: 1 %
MDC IDC STAT BRADY AS VS PERCENT: 1 %
MDC IDC STAT BRADY RV PERCENT PACED: 99 %
Pulse Gen Model: 2240
Pulse Gen Serial Number: 7585584

## 2015-09-29 DIAGNOSIS — I480 Paroxysmal atrial fibrillation: Secondary | ICD-10-CM | POA: Diagnosis not present

## 2015-09-29 DIAGNOSIS — E039 Hypothyroidism, unspecified: Secondary | ICD-10-CM | POA: Diagnosis not present

## 2015-09-29 DIAGNOSIS — M461 Sacroiliitis, not elsewhere classified: Secondary | ICD-10-CM | POA: Diagnosis not present

## 2015-09-29 DIAGNOSIS — R5381 Other malaise: Secondary | ICD-10-CM | POA: Diagnosis not present

## 2015-09-29 DIAGNOSIS — G301 Alzheimer's disease with late onset: Secondary | ICD-10-CM | POA: Diagnosis not present

## 2015-11-04 ENCOUNTER — Ambulatory Visit (INDEPENDENT_AMBULATORY_CARE_PROVIDER_SITE_OTHER): Payer: Medicare Other | Admitting: *Deleted

## 2015-11-04 DIAGNOSIS — I441 Atrioventricular block, second degree: Secondary | ICD-10-CM | POA: Diagnosis not present

## 2015-11-04 NOTE — Progress Notes (Signed)
Remote pacemaker transmission.   

## 2015-11-06 ENCOUNTER — Encounter: Payer: Self-pay | Admitting: Cardiology

## 2015-11-11 DIAGNOSIS — I509 Heart failure, unspecified: Secondary | ICD-10-CM | POA: Diagnosis not present

## 2015-11-11 DIAGNOSIS — G301 Alzheimer's disease with late onset: Secondary | ICD-10-CM | POA: Diagnosis not present

## 2015-11-11 DIAGNOSIS — E213 Hyperparathyroidism, unspecified: Secondary | ICD-10-CM | POA: Diagnosis not present

## 2015-11-11 LAB — CUP PACEART REMOTE DEVICE CHECK
Battery Remaining Longevity: 142 mo
Battery Remaining Percentage: 95.5 %
Battery Voltage: 3.02 V
Brady Statistic AP VS Percent: 1 %
Brady Statistic AS VS Percent: 1 %
Brady Statistic RV Percent Paced: 99 %
Implantable Lead Implant Date: 20150109
Implantable Lead Implant Date: 20150109
Implantable Lead Location: 753860
Implantable Lead Model: 1948
Lead Channel Impedance Value: 510 Ohm
Lead Channel Pacing Threshold Amplitude: 0.5 V
Lead Channel Pacing Threshold Amplitude: 0.5 V
Lead Channel Sensing Intrinsic Amplitude: 4.5 mV
Lead Channel Setting Pacing Pulse Width: 0.4 ms
Lead Channel Setting Sensing Sensitivity: 2 mV
MDC IDC LEAD LOCATION: 753859
MDC IDC MSMT LEADCHNL RA PACING THRESHOLD PULSEWIDTH: 0.4 ms
MDC IDC MSMT LEADCHNL RV IMPEDANCE VALUE: 690 Ohm
MDC IDC MSMT LEADCHNL RV PACING THRESHOLD PULSEWIDTH: 0.4 ms
MDC IDC MSMT LEADCHNL RV SENSING INTR AMPL: 10.6 mV
MDC IDC PG SERIAL: 7585584
MDC IDC SESS DTM: 20170726080014
MDC IDC SET LEADCHNL RA PACING AMPLITUDE: 2 V
MDC IDC SET LEADCHNL RV PACING AMPLITUDE: 0.75 V
MDC IDC STAT BRADY AP VP PERCENT: 1 %
MDC IDC STAT BRADY AS VP PERCENT: 99 %
MDC IDC STAT BRADY RA PERCENT PACED: 1 %

## 2015-11-12 DIAGNOSIS — E039 Hypothyroidism, unspecified: Secondary | ICD-10-CM | POA: Diagnosis not present

## 2015-11-12 DIAGNOSIS — Z79899 Other long term (current) drug therapy: Secondary | ICD-10-CM | POA: Diagnosis not present

## 2016-01-12 DIAGNOSIS — I509 Heart failure, unspecified: Secondary | ICD-10-CM | POA: Diagnosis not present

## 2016-01-12 DIAGNOSIS — G301 Alzheimer's disease with late onset: Secondary | ICD-10-CM | POA: Diagnosis not present

## 2016-01-12 DIAGNOSIS — E213 Hyperparathyroidism, unspecified: Secondary | ICD-10-CM | POA: Diagnosis not present

## 2016-01-22 DIAGNOSIS — E039 Hypothyroidism, unspecified: Secondary | ICD-10-CM | POA: Diagnosis not present

## 2016-02-03 ENCOUNTER — Ambulatory Visit (INDEPENDENT_AMBULATORY_CARE_PROVIDER_SITE_OTHER): Payer: Medicare Other | Admitting: *Deleted

## 2016-02-03 DIAGNOSIS — I441 Atrioventricular block, second degree: Secondary | ICD-10-CM | POA: Diagnosis not present

## 2016-02-03 NOTE — Progress Notes (Signed)
Remote pacemaker transmission.   

## 2016-02-04 ENCOUNTER — Encounter: Payer: Self-pay | Admitting: Cardiology

## 2016-03-03 LAB — CUP PACEART REMOTE DEVICE CHECK
Battery Remaining Longevity: 143 mo
Brady Statistic AS VP Percent: 99 %
Brady Statistic AS VS Percent: 1 %
Brady Statistic RA Percent Paced: 1 %
Implantable Lead Location: 753860
Implantable Lead Model: 1948
Lead Channel Impedance Value: 510 Ohm
Lead Channel Pacing Threshold Amplitude: 0.5 V
Lead Channel Pacing Threshold Pulse Width: 0.4 ms
Lead Channel Pacing Threshold Pulse Width: 0.4 ms
Lead Channel Sensing Intrinsic Amplitude: 10.6 mV
Lead Channel Setting Pacing Amplitude: 2 V
MDC IDC LEAD IMPLANT DT: 20150109
MDC IDC LEAD IMPLANT DT: 20150109
MDC IDC LEAD LOCATION: 753859
MDC IDC MSMT BATTERY REMAINING PERCENTAGE: 95.5 %
MDC IDC MSMT BATTERY VOLTAGE: 3.02 V
MDC IDC MSMT LEADCHNL RA SENSING INTR AMPL: 4.7 mV
MDC IDC MSMT LEADCHNL RV IMPEDANCE VALUE: 690 Ohm
MDC IDC MSMT LEADCHNL RV PACING THRESHOLD AMPLITUDE: 0.375 V
MDC IDC PG IMPLANT DT: 20150109
MDC IDC PG SERIAL: 7585584
MDC IDC SESS DTM: 20171025080016
MDC IDC SET LEADCHNL RV PACING AMPLITUDE: 0.625
MDC IDC SET LEADCHNL RV PACING PULSEWIDTH: 0.4 ms
MDC IDC SET LEADCHNL RV SENSING SENSITIVITY: 2 mV
MDC IDC STAT BRADY AP VP PERCENT: 1 %
MDC IDC STAT BRADY AP VS PERCENT: 1 %
MDC IDC STAT BRADY RV PERCENT PACED: 99 %

## 2016-03-14 DIAGNOSIS — G301 Alzheimer's disease with late onset: Secondary | ICD-10-CM | POA: Diagnosis not present

## 2016-03-14 DIAGNOSIS — I509 Heart failure, unspecified: Secondary | ICD-10-CM | POA: Diagnosis not present

## 2016-05-15 DIAGNOSIS — E213 Hyperparathyroidism, unspecified: Secondary | ICD-10-CM | POA: Diagnosis not present

## 2016-05-15 DIAGNOSIS — G301 Alzheimer's disease with late onset: Secondary | ICD-10-CM | POA: Diagnosis not present

## 2016-05-15 DIAGNOSIS — E039 Hypothyroidism, unspecified: Secondary | ICD-10-CM | POA: Diagnosis not present

## 2016-05-15 DIAGNOSIS — I509 Heart failure, unspecified: Secondary | ICD-10-CM | POA: Diagnosis not present

## 2016-07-17 DIAGNOSIS — I509 Heart failure, unspecified: Secondary | ICD-10-CM | POA: Diagnosis not present

## 2016-07-17 DIAGNOSIS — G301 Alzheimer's disease with late onset: Secondary | ICD-10-CM | POA: Diagnosis not present

## 2016-07-17 DIAGNOSIS — E213 Hyperparathyroidism, unspecified: Secondary | ICD-10-CM | POA: Diagnosis not present

## 2016-07-17 DIAGNOSIS — E039 Hypothyroidism, unspecified: Secondary | ICD-10-CM | POA: Diagnosis not present

## 2016-07-27 DIAGNOSIS — E039 Hypothyroidism, unspecified: Secondary | ICD-10-CM | POA: Diagnosis not present

## 2016-08-10 DIAGNOSIS — H353213 Exudative age-related macular degeneration, right eye, with inactive scar: Secondary | ICD-10-CM | POA: Diagnosis not present

## 2016-08-10 DIAGNOSIS — H2511 Age-related nuclear cataract, right eye: Secondary | ICD-10-CM | POA: Diagnosis not present

## 2016-08-10 DIAGNOSIS — H353123 Nonexudative age-related macular degeneration, left eye, advanced atrophic without subfoveal involvement: Secondary | ICD-10-CM | POA: Diagnosis not present

## 2016-09-15 DIAGNOSIS — N390 Urinary tract infection, site not specified: Secondary | ICD-10-CM | POA: Diagnosis not present

## 2016-09-15 DIAGNOSIS — R319 Hematuria, unspecified: Secondary | ICD-10-CM | POA: Diagnosis not present

## 2016-09-21 DIAGNOSIS — L0292 Furuncle, unspecified: Secondary | ICD-10-CM | POA: Diagnosis not present

## 2016-09-26 DIAGNOSIS — E213 Hyperparathyroidism, unspecified: Secondary | ICD-10-CM | POA: Diagnosis not present

## 2016-09-26 DIAGNOSIS — I509 Heart failure, unspecified: Secondary | ICD-10-CM | POA: Diagnosis not present

## 2016-09-26 DIAGNOSIS — E039 Hypothyroidism, unspecified: Secondary | ICD-10-CM | POA: Diagnosis not present

## 2016-09-26 DIAGNOSIS — G301 Alzheimer's disease with late onset: Secondary | ICD-10-CM | POA: Diagnosis not present

## 2016-10-05 DIAGNOSIS — C44629 Squamous cell carcinoma of skin of left upper limb, including shoulder: Secondary | ICD-10-CM | POA: Diagnosis not present

## 2016-11-14 DIAGNOSIS — I509 Heart failure, unspecified: Secondary | ICD-10-CM | POA: Diagnosis not present

## 2016-11-14 DIAGNOSIS — E213 Hyperparathyroidism, unspecified: Secondary | ICD-10-CM | POA: Diagnosis not present

## 2016-11-14 DIAGNOSIS — G301 Alzheimer's disease with late onset: Secondary | ICD-10-CM | POA: Diagnosis not present

## 2016-11-30 DIAGNOSIS — L570 Actinic keratosis: Secondary | ICD-10-CM | POA: Diagnosis not present

## 2017-01-09 DIAGNOSIS — M6281 Muscle weakness (generalized): Secondary | ICD-10-CM | POA: Diagnosis not present

## 2017-01-09 DIAGNOSIS — M25561 Pain in right knee: Secondary | ICD-10-CM | POA: Diagnosis not present

## 2017-01-23 DIAGNOSIS — K5900 Constipation, unspecified: Secondary | ICD-10-CM | POA: Diagnosis not present

## 2017-01-23 DIAGNOSIS — E441 Mild protein-calorie malnutrition: Secondary | ICD-10-CM | POA: Diagnosis not present

## 2017-01-23 DIAGNOSIS — G8929 Other chronic pain: Secondary | ICD-10-CM | POA: Diagnosis not present

## 2017-01-23 DIAGNOSIS — H3530 Unspecified macular degeneration: Secondary | ICD-10-CM | POA: Diagnosis not present

## 2017-01-23 DIAGNOSIS — E039 Hypothyroidism, unspecified: Secondary | ICD-10-CM | POA: Diagnosis not present

## 2017-02-20 DIAGNOSIS — G8929 Other chronic pain: Secondary | ICD-10-CM | POA: Diagnosis not present

## 2017-02-20 DIAGNOSIS — E559 Vitamin D deficiency, unspecified: Secondary | ICD-10-CM | POA: Diagnosis not present

## 2017-02-20 DIAGNOSIS — M6281 Muscle weakness (generalized): Secondary | ICD-10-CM | POA: Diagnosis not present

## 2017-02-20 DIAGNOSIS — E441 Mild protein-calorie malnutrition: Secondary | ICD-10-CM | POA: Diagnosis not present

## 2017-02-20 DIAGNOSIS — E039 Hypothyroidism, unspecified: Secondary | ICD-10-CM | POA: Diagnosis not present

## 2017-03-22 DIAGNOSIS — E039 Hypothyroidism, unspecified: Secondary | ICD-10-CM | POA: Diagnosis not present

## 2017-03-22 DIAGNOSIS — H3530 Unspecified macular degeneration: Secondary | ICD-10-CM | POA: Diagnosis not present

## 2017-03-22 DIAGNOSIS — K5900 Constipation, unspecified: Secondary | ICD-10-CM | POA: Diagnosis not present

## 2017-03-22 DIAGNOSIS — E559 Vitamin D deficiency, unspecified: Secondary | ICD-10-CM | POA: Diagnosis not present

## 2017-03-22 DIAGNOSIS — M6281 Muscle weakness (generalized): Secondary | ICD-10-CM | POA: Diagnosis not present

## 2017-03-27 DIAGNOSIS — R197 Diarrhea, unspecified: Secondary | ICD-10-CM | POA: Diagnosis not present

## 2017-03-29 DIAGNOSIS — R21 Rash and other nonspecific skin eruption: Secondary | ICD-10-CM | POA: Diagnosis not present

## 2017-04-25 DIAGNOSIS — E559 Vitamin D deficiency, unspecified: Secondary | ICD-10-CM | POA: Diagnosis not present

## 2017-04-25 DIAGNOSIS — R54 Age-related physical debility: Secondary | ICD-10-CM | POA: Diagnosis not present

## 2017-04-25 DIAGNOSIS — K5900 Constipation, unspecified: Secondary | ICD-10-CM | POA: Diagnosis not present

## 2017-04-25 DIAGNOSIS — M6281 Muscle weakness (generalized): Secondary | ICD-10-CM | POA: Diagnosis not present

## 2017-05-25 DIAGNOSIS — G8929 Other chronic pain: Secondary | ICD-10-CM | POA: Diagnosis not present

## 2017-05-25 DIAGNOSIS — E441 Mild protein-calorie malnutrition: Secondary | ICD-10-CM | POA: Diagnosis not present

## 2017-05-25 DIAGNOSIS — R54 Age-related physical debility: Secondary | ICD-10-CM | POA: Diagnosis not present

## 2017-05-25 DIAGNOSIS — E039 Hypothyroidism, unspecified: Secondary | ICD-10-CM | POA: Diagnosis not present

## 2017-05-25 DIAGNOSIS — M6281 Muscle weakness (generalized): Secondary | ICD-10-CM | POA: Diagnosis not present

## 2017-06-27 DIAGNOSIS — K5900 Constipation, unspecified: Secondary | ICD-10-CM | POA: Diagnosis not present

## 2017-06-27 DIAGNOSIS — H3530 Unspecified macular degeneration: Secondary | ICD-10-CM | POA: Diagnosis not present

## 2017-06-27 DIAGNOSIS — E559 Vitamin D deficiency, unspecified: Secondary | ICD-10-CM | POA: Diagnosis not present

## 2017-06-27 DIAGNOSIS — M6281 Muscle weakness (generalized): Secondary | ICD-10-CM | POA: Diagnosis not present

## 2017-06-27 DIAGNOSIS — R54 Age-related physical debility: Secondary | ICD-10-CM | POA: Diagnosis not present

## 2017-07-05 DIAGNOSIS — S0191XA Laceration without foreign body of unspecified part of head, initial encounter: Secondary | ICD-10-CM | POA: Diagnosis not present

## 2017-07-17 DIAGNOSIS — E039 Hypothyroidism, unspecified: Secondary | ICD-10-CM | POA: Diagnosis not present

## 2017-07-17 DIAGNOSIS — D649 Anemia, unspecified: Secondary | ICD-10-CM | POA: Diagnosis not present

## 2017-07-17 DIAGNOSIS — M6281 Muscle weakness (generalized): Secondary | ICD-10-CM | POA: Diagnosis not present

## 2017-07-18 DIAGNOSIS — E559 Vitamin D deficiency, unspecified: Secondary | ICD-10-CM | POA: Diagnosis not present

## 2017-07-18 DIAGNOSIS — R54 Age-related physical debility: Secondary | ICD-10-CM | POA: Diagnosis not present

## 2017-07-18 DIAGNOSIS — E039 Hypothyroidism, unspecified: Secondary | ICD-10-CM | POA: Diagnosis not present

## 2017-07-18 DIAGNOSIS — H3530 Unspecified macular degeneration: Secondary | ICD-10-CM | POA: Diagnosis not present

## 2017-07-18 DIAGNOSIS — K5900 Constipation, unspecified: Secondary | ICD-10-CM | POA: Diagnosis not present

## 2017-07-27 DIAGNOSIS — E039 Hypothyroidism, unspecified: Secondary | ICD-10-CM | POA: Diagnosis not present

## 2017-07-27 DIAGNOSIS — G8929 Other chronic pain: Secondary | ICD-10-CM | POA: Diagnosis not present

## 2017-07-27 DIAGNOSIS — M6281 Muscle weakness (generalized): Secondary | ICD-10-CM | POA: Diagnosis not present

## 2017-07-27 DIAGNOSIS — R54 Age-related physical debility: Secondary | ICD-10-CM | POA: Diagnosis not present

## 2017-08-29 DIAGNOSIS — R296 Repeated falls: Secondary | ICD-10-CM | POA: Diagnosis not present

## 2017-08-29 DIAGNOSIS — W19XXXA Unspecified fall, initial encounter: Secondary | ICD-10-CM | POA: Diagnosis not present

## 2017-08-31 DIAGNOSIS — M6281 Muscle weakness (generalized): Secondary | ICD-10-CM | POA: Diagnosis not present

## 2017-08-31 DIAGNOSIS — K5900 Constipation, unspecified: Secondary | ICD-10-CM | POA: Diagnosis not present

## 2017-08-31 DIAGNOSIS — H3530 Unspecified macular degeneration: Secondary | ICD-10-CM | POA: Diagnosis not present

## 2017-08-31 DIAGNOSIS — E559 Vitamin D deficiency, unspecified: Secondary | ICD-10-CM | POA: Diagnosis not present

## 2017-08-31 DIAGNOSIS — R54 Age-related physical debility: Secondary | ICD-10-CM | POA: Diagnosis not present

## 2017-10-02 DIAGNOSIS — R54 Age-related physical debility: Secondary | ICD-10-CM | POA: Diagnosis not present

## 2017-10-02 DIAGNOSIS — G8929 Other chronic pain: Secondary | ICD-10-CM | POA: Diagnosis not present

## 2017-10-02 DIAGNOSIS — E039 Hypothyroidism, unspecified: Secondary | ICD-10-CM | POA: Diagnosis not present

## 2017-10-02 DIAGNOSIS — H3530 Unspecified macular degeneration: Secondary | ICD-10-CM | POA: Diagnosis not present

## 2017-10-02 DIAGNOSIS — M6281 Muscle weakness (generalized): Secondary | ICD-10-CM | POA: Diagnosis not present

## 2017-10-03 DIAGNOSIS — S51001A Unspecified open wound of right elbow, initial encounter: Secondary | ICD-10-CM | POA: Diagnosis not present

## 2017-10-03 DIAGNOSIS — R296 Repeated falls: Secondary | ICD-10-CM | POA: Diagnosis not present

## 2017-11-08 DIAGNOSIS — K5900 Constipation, unspecified: Secondary | ICD-10-CM | POA: Diagnosis not present

## 2017-11-08 DIAGNOSIS — E039 Hypothyroidism, unspecified: Secondary | ICD-10-CM | POA: Diagnosis not present

## 2017-11-08 DIAGNOSIS — H3530 Unspecified macular degeneration: Secondary | ICD-10-CM | POA: Diagnosis not present

## 2017-11-08 DIAGNOSIS — M6281 Muscle weakness (generalized): Secondary | ICD-10-CM | POA: Diagnosis not present

## 2017-11-08 DIAGNOSIS — E559 Vitamin D deficiency, unspecified: Secondary | ICD-10-CM | POA: Diagnosis not present

## 2017-12-04 DIAGNOSIS — D649 Anemia, unspecified: Secondary | ICD-10-CM | POA: Diagnosis not present

## 2017-12-04 DIAGNOSIS — H00016 Hordeolum externum left eye, unspecified eyelid: Secondary | ICD-10-CM | POA: Diagnosis not present

## 2017-12-04 DIAGNOSIS — M6281 Muscle weakness (generalized): Secondary | ICD-10-CM | POA: Diagnosis not present

## 2017-12-04 DIAGNOSIS — E039 Hypothyroidism, unspecified: Secondary | ICD-10-CM | POA: Diagnosis not present

## 2017-12-06 DIAGNOSIS — H3530 Unspecified macular degeneration: Secondary | ICD-10-CM | POA: Diagnosis not present

## 2017-12-06 DIAGNOSIS — E559 Vitamin D deficiency, unspecified: Secondary | ICD-10-CM | POA: Diagnosis not present

## 2017-12-06 DIAGNOSIS — R54 Age-related physical debility: Secondary | ICD-10-CM | POA: Diagnosis not present

## 2017-12-06 DIAGNOSIS — E039 Hypothyroidism, unspecified: Secondary | ICD-10-CM | POA: Diagnosis not present

## 2017-12-06 DIAGNOSIS — K5900 Constipation, unspecified: Secondary | ICD-10-CM | POA: Diagnosis not present

## 2017-12-15 DIAGNOSIS — H3530 Unspecified macular degeneration: Secondary | ICD-10-CM | POA: Diagnosis not present

## 2017-12-15 DIAGNOSIS — K5900 Constipation, unspecified: Secondary | ICD-10-CM | POA: Diagnosis not present

## 2017-12-15 DIAGNOSIS — R2681 Unsteadiness on feet: Secondary | ICD-10-CM | POA: Diagnosis not present

## 2017-12-15 DIAGNOSIS — R54 Age-related physical debility: Secondary | ICD-10-CM | POA: Diagnosis not present

## 2017-12-15 DIAGNOSIS — J309 Allergic rhinitis, unspecified: Secondary | ICD-10-CM | POA: Diagnosis not present

## 2017-12-25 DIAGNOSIS — R05 Cough: Secondary | ICD-10-CM | POA: Diagnosis not present

## 2017-12-28 DIAGNOSIS — I10 Essential (primary) hypertension: Secondary | ICD-10-CM | POA: Diagnosis not present

## 2017-12-28 DIAGNOSIS — R0602 Shortness of breath: Secondary | ICD-10-CM | POA: Diagnosis not present

## 2017-12-29 DIAGNOSIS — I509 Heart failure, unspecified: Secondary | ICD-10-CM | POA: Diagnosis not present

## 2017-12-29 DIAGNOSIS — Z950 Presence of cardiac pacemaker: Secondary | ICD-10-CM | POA: Diagnosis not present

## 2018-01-01 DIAGNOSIS — I5040 Unspecified combined systolic (congestive) and diastolic (congestive) heart failure: Secondary | ICD-10-CM | POA: Diagnosis not present

## 2018-01-04 DIAGNOSIS — I272 Pulmonary hypertension, unspecified: Secondary | ICD-10-CM | POA: Diagnosis not present

## 2018-02-01 DIAGNOSIS — R2689 Other abnormalities of gait and mobility: Secondary | ICD-10-CM | POA: Diagnosis not present

## 2018-02-01 DIAGNOSIS — Z950 Presence of cardiac pacemaker: Secondary | ICD-10-CM | POA: Diagnosis not present

## 2018-02-05 DIAGNOSIS — R2689 Other abnormalities of gait and mobility: Secondary | ICD-10-CM | POA: Diagnosis not present

## 2018-02-05 DIAGNOSIS — Z950 Presence of cardiac pacemaker: Secondary | ICD-10-CM | POA: Diagnosis not present

## 2018-02-07 DIAGNOSIS — Z950 Presence of cardiac pacemaker: Secondary | ICD-10-CM | POA: Diagnosis not present

## 2018-02-07 DIAGNOSIS — R2689 Other abnormalities of gait and mobility: Secondary | ICD-10-CM | POA: Diagnosis not present

## 2018-02-08 DIAGNOSIS — Z950 Presence of cardiac pacemaker: Secondary | ICD-10-CM | POA: Diagnosis not present

## 2018-02-08 DIAGNOSIS — R2689 Other abnormalities of gait and mobility: Secondary | ICD-10-CM | POA: Diagnosis not present

## 2018-02-12 DIAGNOSIS — Z950 Presence of cardiac pacemaker: Secondary | ICD-10-CM | POA: Diagnosis not present

## 2018-02-12 DIAGNOSIS — R2689 Other abnormalities of gait and mobility: Secondary | ICD-10-CM | POA: Diagnosis not present

## 2018-02-13 DIAGNOSIS — R54 Age-related physical debility: Secondary | ICD-10-CM | POA: Diagnosis not present

## 2018-02-13 DIAGNOSIS — K5900 Constipation, unspecified: Secondary | ICD-10-CM | POA: Diagnosis not present

## 2018-02-13 DIAGNOSIS — E039 Hypothyroidism, unspecified: Secondary | ICD-10-CM | POA: Diagnosis not present

## 2018-02-13 DIAGNOSIS — H3530 Unspecified macular degeneration: Secondary | ICD-10-CM | POA: Diagnosis not present

## 2018-02-13 DIAGNOSIS — E559 Vitamin D deficiency, unspecified: Secondary | ICD-10-CM | POA: Diagnosis not present

## 2018-02-14 DIAGNOSIS — Z950 Presence of cardiac pacemaker: Secondary | ICD-10-CM | POA: Diagnosis not present

## 2018-02-14 DIAGNOSIS — R2689 Other abnormalities of gait and mobility: Secondary | ICD-10-CM | POA: Diagnosis not present

## 2018-02-15 DIAGNOSIS — Z950 Presence of cardiac pacemaker: Secondary | ICD-10-CM | POA: Diagnosis not present

## 2018-02-15 DIAGNOSIS — R2689 Other abnormalities of gait and mobility: Secondary | ICD-10-CM | POA: Diagnosis not present

## 2018-02-16 DIAGNOSIS — R2689 Other abnormalities of gait and mobility: Secondary | ICD-10-CM | POA: Diagnosis not present

## 2018-02-16 DIAGNOSIS — Z950 Presence of cardiac pacemaker: Secondary | ICD-10-CM | POA: Diagnosis not present

## 2018-02-19 DIAGNOSIS — Z950 Presence of cardiac pacemaker: Secondary | ICD-10-CM | POA: Diagnosis not present

## 2018-02-19 DIAGNOSIS — R2689 Other abnormalities of gait and mobility: Secondary | ICD-10-CM | POA: Diagnosis not present

## 2018-02-20 DIAGNOSIS — Z950 Presence of cardiac pacemaker: Secondary | ICD-10-CM | POA: Diagnosis not present

## 2018-02-20 DIAGNOSIS — R2689 Other abnormalities of gait and mobility: Secondary | ICD-10-CM | POA: Diagnosis not present

## 2018-02-21 DIAGNOSIS — Z950 Presence of cardiac pacemaker: Secondary | ICD-10-CM | POA: Diagnosis not present

## 2018-02-21 DIAGNOSIS — R2689 Other abnormalities of gait and mobility: Secondary | ICD-10-CM | POA: Diagnosis not present

## 2018-02-26 DIAGNOSIS — R2689 Other abnormalities of gait and mobility: Secondary | ICD-10-CM | POA: Diagnosis not present

## 2018-02-26 DIAGNOSIS — Z950 Presence of cardiac pacemaker: Secondary | ICD-10-CM | POA: Diagnosis not present

## 2018-02-27 DIAGNOSIS — Z950 Presence of cardiac pacemaker: Secondary | ICD-10-CM | POA: Diagnosis not present

## 2018-02-27 DIAGNOSIS — R2689 Other abnormalities of gait and mobility: Secondary | ICD-10-CM | POA: Diagnosis not present

## 2018-02-28 DIAGNOSIS — Z950 Presence of cardiac pacemaker: Secondary | ICD-10-CM | POA: Diagnosis not present

## 2018-02-28 DIAGNOSIS — R2689 Other abnormalities of gait and mobility: Secondary | ICD-10-CM | POA: Diagnosis not present

## 2018-03-14 DIAGNOSIS — R296 Repeated falls: Secondary | ICD-10-CM | POA: Diagnosis not present

## 2018-03-14 DIAGNOSIS — S50311A Abrasion of right elbow, initial encounter: Secondary | ICD-10-CM | POA: Diagnosis not present

## 2018-03-15 DIAGNOSIS — I10 Essential (primary) hypertension: Secondary | ICD-10-CM | POA: Diagnosis not present

## 2018-03-15 DIAGNOSIS — E039 Hypothyroidism, unspecified: Secondary | ICD-10-CM | POA: Diagnosis not present

## 2018-03-15 DIAGNOSIS — D649 Anemia, unspecified: Secondary | ICD-10-CM | POA: Diagnosis not present

## 2018-03-29 DIAGNOSIS — H3530 Unspecified macular degeneration: Secondary | ICD-10-CM | POA: Diagnosis not present

## 2018-03-29 DIAGNOSIS — S51811A Laceration without foreign body of right forearm, initial encounter: Secondary | ICD-10-CM | POA: Diagnosis not present

## 2018-03-29 DIAGNOSIS — R54 Age-related physical debility: Secondary | ICD-10-CM | POA: Diagnosis not present

## 2018-03-29 DIAGNOSIS — E559 Vitamin D deficiency, unspecified: Secondary | ICD-10-CM | POA: Diagnosis not present

## 2018-03-29 DIAGNOSIS — K5900 Constipation, unspecified: Secondary | ICD-10-CM | POA: Diagnosis not present

## 2018-04-19 DIAGNOSIS — H3530 Unspecified macular degeneration: Secondary | ICD-10-CM | POA: Diagnosis not present

## 2018-04-19 DIAGNOSIS — R54 Age-related physical debility: Secondary | ICD-10-CM | POA: Diagnosis not present

## 2018-04-19 DIAGNOSIS — K5900 Constipation, unspecified: Secondary | ICD-10-CM | POA: Diagnosis not present

## 2018-04-19 DIAGNOSIS — E039 Hypothyroidism, unspecified: Secondary | ICD-10-CM | POA: Diagnosis not present

## 2018-04-19 DIAGNOSIS — E559 Vitamin D deficiency, unspecified: Secondary | ICD-10-CM | POA: Diagnosis not present

## 2018-04-23 DIAGNOSIS — Z0000 Encounter for general adult medical examination without abnormal findings: Secondary | ICD-10-CM | POA: Diagnosis not present

## 2018-04-23 DIAGNOSIS — H00016 Hordeolum externum left eye, unspecified eyelid: Secondary | ICD-10-CM | POA: Diagnosis not present

## 2018-04-27 DIAGNOSIS — R05 Cough: Secondary | ICD-10-CM | POA: Diagnosis not present

## 2018-04-27 DIAGNOSIS — I10 Essential (primary) hypertension: Secondary | ICD-10-CM | POA: Diagnosis not present

## 2018-04-27 DIAGNOSIS — R319 Hematuria, unspecified: Secondary | ICD-10-CM | POA: Diagnosis not present

## 2018-04-27 DIAGNOSIS — D649 Anemia, unspecified: Secondary | ICD-10-CM | POA: Diagnosis not present

## 2018-04-27 DIAGNOSIS — M6281 Muscle weakness (generalized): Secondary | ICD-10-CM | POA: Diagnosis not present

## 2018-04-27 DIAGNOSIS — Z79899 Other long term (current) drug therapy: Secondary | ICD-10-CM | POA: Diagnosis not present

## 2018-06-07 DIAGNOSIS — K5900 Constipation, unspecified: Secondary | ICD-10-CM | POA: Diagnosis not present

## 2018-06-07 DIAGNOSIS — E039 Hypothyroidism, unspecified: Secondary | ICD-10-CM | POA: Diagnosis not present

## 2018-06-07 DIAGNOSIS — H3530 Unspecified macular degeneration: Secondary | ICD-10-CM | POA: Diagnosis not present

## 2018-06-07 DIAGNOSIS — R54 Age-related physical debility: Secondary | ICD-10-CM | POA: Diagnosis not present

## 2018-06-07 DIAGNOSIS — E559 Vitamin D deficiency, unspecified: Secondary | ICD-10-CM | POA: Diagnosis not present

## 2018-06-22 DIAGNOSIS — H3530 Unspecified macular degeneration: Secondary | ICD-10-CM | POA: Diagnosis not present

## 2018-06-22 DIAGNOSIS — E039 Hypothyroidism, unspecified: Secondary | ICD-10-CM | POA: Diagnosis not present

## 2018-06-22 DIAGNOSIS — R54 Age-related physical debility: Secondary | ICD-10-CM | POA: Diagnosis not present

## 2018-06-22 DIAGNOSIS — K5900 Constipation, unspecified: Secondary | ICD-10-CM | POA: Diagnosis not present

## 2018-06-22 DIAGNOSIS — R296 Repeated falls: Secondary | ICD-10-CM | POA: Diagnosis not present

## 2018-07-08 DIAGNOSIS — R05 Cough: Secondary | ICD-10-CM | POA: Diagnosis not present

## 2018-07-08 DIAGNOSIS — Z950 Presence of cardiac pacemaker: Secondary | ICD-10-CM | POA: Diagnosis not present

## 2018-07-24 DIAGNOSIS — E785 Hyperlipidemia, unspecified: Secondary | ICD-10-CM | POA: Diagnosis not present

## 2018-07-24 DIAGNOSIS — E039 Hypothyroidism, unspecified: Secondary | ICD-10-CM | POA: Diagnosis not present

## 2018-07-31 DIAGNOSIS — M2550 Pain in unspecified joint: Secondary | ICD-10-CM | POA: Diagnosis not present

## 2018-07-31 DIAGNOSIS — H3530 Unspecified macular degeneration: Secondary | ICD-10-CM | POA: Diagnosis not present

## 2018-07-31 DIAGNOSIS — Z79899 Other long term (current) drug therapy: Secondary | ICD-10-CM | POA: Diagnosis not present

## 2018-07-31 DIAGNOSIS — K5900 Constipation, unspecified: Secondary | ICD-10-CM | POA: Diagnosis not present

## 2018-07-31 DIAGNOSIS — R296 Repeated falls: Secondary | ICD-10-CM | POA: Diagnosis not present

## 2018-07-31 DIAGNOSIS — I5040 Unspecified combined systolic (congestive) and diastolic (congestive) heart failure: Secondary | ICD-10-CM | POA: Diagnosis not present

## 2018-07-31 DIAGNOSIS — A0472 Enterocolitis due to Clostridium difficile, not specified as recurrent: Secondary | ICD-10-CM | POA: Diagnosis not present

## 2018-07-31 DIAGNOSIS — R54 Age-related physical debility: Secondary | ICD-10-CM | POA: Diagnosis not present

## 2018-07-31 DIAGNOSIS — E7800 Pure hypercholesterolemia, unspecified: Secondary | ICD-10-CM | POA: Diagnosis not present

## 2018-07-31 DIAGNOSIS — E441 Mild protein-calorie malnutrition: Secondary | ICD-10-CM | POA: Diagnosis not present

## 2018-07-31 DIAGNOSIS — E039 Hypothyroidism, unspecified: Secondary | ICD-10-CM | POA: Diagnosis not present

## 2018-08-15 ENCOUNTER — Telehealth: Payer: Self-pay | Admitting: Physician Assistant

## 2018-08-15 NOTE — Telephone Encounter (Signed)
Called regarding over due pacer check, lost to follow up.  Both listed numbers for the patient are the same numbers for [name], relation is listed as "other".  There is no DPR on file.  Left message at [phone], calling for Vanessa Keller, to call the clinic back at her convenience, left direct number for DC.  Tommye Standard, [facility]

## 2018-08-17 ENCOUNTER — Ambulatory Visit (INDEPENDENT_AMBULATORY_CARE_PROVIDER_SITE_OTHER): Payer: Medicare Other | Admitting: *Deleted

## 2018-08-17 ENCOUNTER — Telehealth: Payer: Self-pay | Admitting: Cardiology

## 2018-08-17 DIAGNOSIS — I441 Atrioventricular block, second degree: Secondary | ICD-10-CM | POA: Diagnosis not present

## 2018-08-17 NOTE — Telephone Encounter (Signed)
Vanessa Keller patient POA called back and stated that pt is in [employer] care. He isn't sure if pt has a home monitor.    Spoke w/ pt nurse and instructed her how to send a manual transmission. Informed her that if the remote transmission is not received by Monday we will call back. Pt nurse verbalized understanding.

## 2018-08-21 ENCOUNTER — Other Ambulatory Visit: Payer: Self-pay

## 2018-08-21 ENCOUNTER — Encounter: Payer: Self-pay | Admitting: Cardiology

## 2018-08-21 NOTE — Progress Notes (Signed)
Remote pacemaker transmission.   

## 2018-08-21 NOTE — Telephone Encounter (Signed)
Transmission received 08/17/2018 and added to remote schedule for 08/17/2018

## 2018-08-22 LAB — CUP PACEART REMOTE DEVICE CHECK
Date Time Interrogation Session: 20200513080358
Implantable Lead Implant Date: 20150109
Implantable Lead Implant Date: 20150109
Implantable Lead Location: 753859
Implantable Lead Location: 753860
Implantable Lead Model: 1948
Implantable Pulse Generator Implant Date: 20150109
Pulse Gen Model: 2240
Pulse Gen Serial Number: 7585584

## 2018-09-07 DIAGNOSIS — Z79899 Other long term (current) drug therapy: Secondary | ICD-10-CM | POA: Diagnosis not present

## 2018-09-07 DIAGNOSIS — D649 Anemia, unspecified: Secondary | ICD-10-CM | POA: Diagnosis not present

## 2018-09-07 DIAGNOSIS — N390 Urinary tract infection, site not specified: Secondary | ICD-10-CM | POA: Diagnosis not present

## 2018-09-07 DIAGNOSIS — I5040 Unspecified combined systolic (congestive) and diastolic (congestive) heart failure: Secondary | ICD-10-CM | POA: Diagnosis not present

## 2018-09-07 DIAGNOSIS — R319 Hematuria, unspecified: Secondary | ICD-10-CM | POA: Diagnosis not present

## 2018-09-07 DIAGNOSIS — I10 Essential (primary) hypertension: Secondary | ICD-10-CM | POA: Diagnosis not present

## 2018-09-10 DIAGNOSIS — H3530 Unspecified macular degeneration: Secondary | ICD-10-CM | POA: Diagnosis not present

## 2018-09-10 DIAGNOSIS — R54 Age-related physical debility: Secondary | ICD-10-CM | POA: Diagnosis not present

## 2018-09-10 DIAGNOSIS — R296 Repeated falls: Secondary | ICD-10-CM | POA: Diagnosis not present

## 2018-09-10 DIAGNOSIS — E039 Hypothyroidism, unspecified: Secondary | ICD-10-CM | POA: Diagnosis not present

## 2018-09-10 DIAGNOSIS — K5900 Constipation, unspecified: Secondary | ICD-10-CM | POA: Diagnosis not present

## 2018-09-24 DIAGNOSIS — R296 Repeated falls: Secondary | ICD-10-CM | POA: Diagnosis not present

## 2018-09-24 DIAGNOSIS — Z7409 Other reduced mobility: Secondary | ICD-10-CM | POA: Diagnosis not present

## 2018-10-03 DIAGNOSIS — M1711 Unilateral primary osteoarthritis, right knee: Secondary | ICD-10-CM | POA: Diagnosis not present

## 2018-10-03 DIAGNOSIS — M1712 Unilateral primary osteoarthritis, left knee: Secondary | ICD-10-CM | POA: Diagnosis not present

## 2018-10-05 DIAGNOSIS — R296 Repeated falls: Secondary | ICD-10-CM | POA: Diagnosis not present

## 2018-10-05 DIAGNOSIS — R52 Pain, unspecified: Secondary | ICD-10-CM | POA: Diagnosis not present

## 2018-10-05 DIAGNOSIS — E559 Vitamin D deficiency, unspecified: Secondary | ICD-10-CM | POA: Diagnosis not present

## 2018-10-05 DIAGNOSIS — Z7409 Other reduced mobility: Secondary | ICD-10-CM | POA: Diagnosis not present

## 2018-10-08 DIAGNOSIS — R296 Repeated falls: Secondary | ICD-10-CM | POA: Diagnosis not present

## 2018-10-08 DIAGNOSIS — M6281 Muscle weakness (generalized): Secondary | ICD-10-CM | POA: Diagnosis not present

## 2018-10-08 DIAGNOSIS — E559 Vitamin D deficiency, unspecified: Secondary | ICD-10-CM | POA: Diagnosis not present

## 2018-10-08 DIAGNOSIS — M1990 Unspecified osteoarthritis, unspecified site: Secondary | ICD-10-CM | POA: Diagnosis not present

## 2018-10-08 DIAGNOSIS — G894 Chronic pain syndrome: Secondary | ICD-10-CM | POA: Diagnosis not present

## 2018-10-09 DIAGNOSIS — R54 Age-related physical debility: Secondary | ICD-10-CM | POA: Diagnosis not present

## 2018-10-09 DIAGNOSIS — E039 Hypothyroidism, unspecified: Secondary | ICD-10-CM | POA: Diagnosis not present

## 2018-10-09 DIAGNOSIS — H3530 Unspecified macular degeneration: Secondary | ICD-10-CM | POA: Diagnosis not present

## 2018-10-09 DIAGNOSIS — R296 Repeated falls: Secondary | ICD-10-CM | POA: Diagnosis not present

## 2018-10-09 DIAGNOSIS — K5900 Constipation, unspecified: Secondary | ICD-10-CM | POA: Diagnosis not present

## 2018-10-10 DIAGNOSIS — R296 Repeated falls: Secondary | ICD-10-CM | POA: Diagnosis not present

## 2018-10-10 DIAGNOSIS — M6281 Muscle weakness (generalized): Secondary | ICD-10-CM | POA: Diagnosis not present

## 2018-10-11 DIAGNOSIS — R52 Pain, unspecified: Secondary | ICD-10-CM | POA: Diagnosis not present

## 2018-10-11 DIAGNOSIS — R319 Hematuria, unspecified: Secondary | ICD-10-CM | POA: Diagnosis not present

## 2018-10-11 DIAGNOSIS — T887XXD Unspecified adverse effect of drug or medicament, subsequent encounter: Secondary | ICD-10-CM | POA: Diagnosis not present

## 2018-10-15 DIAGNOSIS — E039 Hypothyroidism, unspecified: Secondary | ICD-10-CM | POA: Diagnosis not present

## 2018-10-15 DIAGNOSIS — D649 Anemia, unspecified: Secondary | ICD-10-CM | POA: Diagnosis not present

## 2018-10-15 DIAGNOSIS — I441 Atrioventricular block, second degree: Secondary | ICD-10-CM | POA: Diagnosis not present

## 2018-10-15 DIAGNOSIS — R296 Repeated falls: Secondary | ICD-10-CM | POA: Diagnosis not present

## 2018-10-15 DIAGNOSIS — M6281 Muscle weakness (generalized): Secondary | ICD-10-CM | POA: Diagnosis not present

## 2018-10-18 DIAGNOSIS — R296 Repeated falls: Secondary | ICD-10-CM | POA: Diagnosis not present

## 2018-10-18 DIAGNOSIS — M6281 Muscle weakness (generalized): Secondary | ICD-10-CM | POA: Diagnosis not present

## 2018-10-19 DIAGNOSIS — H1012 Acute atopic conjunctivitis, left eye: Secondary | ICD-10-CM | POA: Diagnosis not present

## 2018-10-22 DIAGNOSIS — R296 Repeated falls: Secondary | ICD-10-CM | POA: Diagnosis not present

## 2018-10-22 DIAGNOSIS — M6281 Muscle weakness (generalized): Secondary | ICD-10-CM | POA: Diagnosis not present

## 2018-10-26 ENCOUNTER — Non-Acute Institutional Stay: Payer: Self-pay | Admitting: Internal Medicine

## 2018-10-26 DIAGNOSIS — Z515 Encounter for palliative care: Secondary | ICD-10-CM

## 2018-11-02 ENCOUNTER — Telehealth: Payer: Self-pay | Admitting: Internal Medicine

## 2018-11-02 NOTE — Telephone Encounter (Signed)
Telephone call to financial POA, Mr. Sabra Heck and [place], Ms. Moore in reference to updates of pt's current health status and degree of decline.  Niece, Ms. Clarene Essex was also notified per request of Ms. Moore.  Goals of care were reviewed along with advanced care directives.  Both agree that Ms. Knittel is not to be resuscitated or intubated in the event of her demise, no return to the hospital, and no feeding tube placement.  The primary goal is one of comfort without heroic measures.  They are in agreement with plans for transition to [facility] if recommended by facility provider and Hospice MD.  Support given.  Addendum:  Conversations with Humberto Leep, NP and Dr. Konrad Dolores who agree with eligibility of transition to Hospice care.  Order received and will be faxed to [facility] referral center.    Pt. Representatives will be notified of same.  Gonzella Lex, NP-C

## 2018-11-02 NOTE — Progress Notes (Signed)
    [facility] Consult Note Telephone: [phone]  Fax: [phone]  PATIENT NAME: Vanessa Keller DOB: 28-May-1915 MRN: [id number]  PRIMARY CARE PROVIDER:   Burnis Medin, MD  REFERRING PROVIDER: Humberto Leep, NP/ Dr. Daylene Posey  RESPONSIBLE PARTY:   Vanessa Keller(friend/HCPOA) [phone]; [phone] hm Vanessa Keller(financial POA)  Vanessa Keller(neice) [phone]   RECOMMENDATIONS and PLAN:  Palliative Care Encounter Z51.5  1.  Dysphagia:  Downgrade meals to mechanical soft to assist with chewing.  Monitor for potential aspirations.  2.  Anorexia with weight loss:  6# wt. loss in 2 months. Current BMI is 16.   Related to dysphagia along with decline of cognitive function.  Continue to offer comfort foods and nutritional supplements 2-3x/day.  Monitor weight weekly.  3.  Memory Loss: Related to advanced dementia and age. FAST stage 7c.  No imaging results available. Anticipate additional decline in the near future.  Continue supportive care.   4. Generalized pain:  Improved with use of Tramadol.  Monitor  5.  Advanced care planning:  Patient is unable to make own decisions.  DNR in place on chart.  Plan on discussion with POA for review of goals of care and advanced directives.  Consider transition to Hospice care for comfort measures if no escalation of care is desired by POA/family.    Due to the COVID-19 crisis, this visit was performed via Telehealth from my office and was initiated by patient and or family/gaurdian.    I spent 25 minutes providing this consultation,  from 1300 to 1325. More than 50% of the time in this consultation was spent coordinating communication with patient and clinical staff.   HISTORY OF PRESENT ILLNESS:  Vanessa Keller is a 85 y.o. year old female with multiple medical problems including dementia without behaviors, heart block, hip fracture with recent surgical repair. Clinical staff reports a  2 week history of decreased nutritional intake <25% and increased confusion.  She requires 2 person tranfers, is non-ambulatory and requires total care for all ADLs.  She is incontinent of B&B. She has lost weight over the past 3 months. May 110#, June 101# and July 104#.  She is 67" tall. Palliative Care was asked to help address goals of care.   CODE STATUS: DNAR  PPS: 30% HOSPICE ELIGIBILITY/DIAGNOSIS: YES/End stage dementia  PAST MEDICAL HISTORY:  Past Medical History:  Diagnosis Date  . Fall 08/29/2010  . Hearing loss    wears hearing aids  . Hypothyroidism   . Osteoporosis   . Pacemaker 04/19/2013   [facility] Jude Medical Assurity DR  model [id number] (serial number  [id number] ) DUAL CHAMBER  . Second degree Mobitz II AV block   . Secondary hyperparathyroidism ([place])   . Urinary incontinence   . Urinary urgency      PHYSICAL EXAM:   General: NAD, frail appearing, thin elderly female in wheelchair Cardiovascular: regular rate and rhythm Pulmonary: unlabored respirations Abdomen: nondistended Extremities: no edema of lower ext. Skin: exposed skin is intact Neurological: alert, non-comunicative.  Unable to determine orientation due to cognitive deficits.   Gonzella Lex, NP-C

## 2018-11-07 DIAGNOSIS — G8929 Other chronic pain: Secondary | ICD-10-CM | POA: Diagnosis not present

## 2018-11-07 DIAGNOSIS — R197 Diarrhea, unspecified: Secondary | ICD-10-CM | POA: Diagnosis not present

## 2018-11-07 DIAGNOSIS — E559 Vitamin D deficiency, unspecified: Secondary | ICD-10-CM | POA: Diagnosis not present

## 2018-11-07 DIAGNOSIS — E039 Hypothyroidism, unspecified: Secondary | ICD-10-CM | POA: Diagnosis not present

## 2018-11-07 DIAGNOSIS — K5900 Constipation, unspecified: Secondary | ICD-10-CM | POA: Diagnosis not present

## 2018-11-16 ENCOUNTER — Ambulatory Visit (INDEPENDENT_AMBULATORY_CARE_PROVIDER_SITE_OTHER): Payer: Medicare Other | Admitting: *Deleted

## 2018-11-16 DIAGNOSIS — I441 Atrioventricular block, second degree: Secondary | ICD-10-CM

## 2018-11-16 LAB — CUP PACEART REMOTE DEVICE CHECK
Battery Remaining Longevity: 140 mo
Battery Remaining Percentage: 95.5 %
Battery Voltage: 2.99 V
Brady Statistic AP VP Percent: 1 %
Brady Statistic AP VS Percent: 1 %
Brady Statistic AS VP Percent: 98 %
Brady Statistic AS VS Percent: 1 %
Brady Statistic RA Percent Paced: 1 %
Brady Statistic RV Percent Paced: 99 %
Date Time Interrogation Session: 20200807060014
Implantable Lead Implant Date: 20150109
Implantable Lead Implant Date: 20150109
Implantable Lead Location: 753859
Implantable Lead Location: 753860
Implantable Lead Model: 1948
Implantable Pulse Generator Implant Date: 20150109
Lead Channel Impedance Value: 480 Ohm
Lead Channel Impedance Value: 690 Ohm
Lead Channel Pacing Threshold Amplitude: 0.375 V
Lead Channel Pacing Threshold Amplitude: 0.5 V
Lead Channel Pacing Threshold Pulse Width: 0.4 ms
Lead Channel Pacing Threshold Pulse Width: 0.4 ms
Lead Channel Sensing Intrinsic Amplitude: 12 mV
Lead Channel Sensing Intrinsic Amplitude: 4.5 mV
Lead Channel Setting Pacing Amplitude: 0.625
Lead Channel Setting Pacing Amplitude: 2 V
Lead Channel Setting Pacing Pulse Width: 0.4 ms
Lead Channel Setting Sensing Sensitivity: 2 mV
Pulse Gen Model: 2240
Pulse Gen Serial Number: 7585584

## 2018-11-20 ENCOUNTER — Encounter: Payer: Self-pay | Admitting: Cardiology

## 2018-11-20 NOTE — Progress Notes (Signed)
Remote pacemaker transmission.   

## 2018-11-28 DIAGNOSIS — S70212A Abrasion, left hip, initial encounter: Secondary | ICD-10-CM | POA: Diagnosis not present

## 2018-11-28 DIAGNOSIS — R296 Repeated falls: Secondary | ICD-10-CM | POA: Diagnosis not present

## 2018-11-28 DIAGNOSIS — Z7409 Other reduced mobility: Secondary | ICD-10-CM | POA: Diagnosis not present

## 2018-12-09 DIAGNOSIS — Z03818 Encounter for observation for suspected exposure to other biological agents ruled out: Secondary | ICD-10-CM | POA: Diagnosis not present

## 2018-12-19 DIAGNOSIS — Z03818 Encounter for observation for suspected exposure to other biological agents ruled out: Secondary | ICD-10-CM | POA: Diagnosis not present

## 2018-12-24 DIAGNOSIS — R05 Cough: Secondary | ICD-10-CM | POA: Diagnosis not present

## 2019-02-07 DIAGNOSIS — Z03818 Encounter for observation for suspected exposure to other biological agents ruled out: Secondary | ICD-10-CM | POA: Diagnosis not present

## 2019-02-11 DIAGNOSIS — K1370 Unspecified lesions of oral mucosa: Secondary | ICD-10-CM | POA: Diagnosis not present

## 2019-02-11 DIAGNOSIS — R54 Age-related physical debility: Secondary | ICD-10-CM | POA: Diagnosis not present

## 2019-02-11 DIAGNOSIS — R451 Restlessness and agitation: Secondary | ICD-10-CM | POA: Diagnosis not present

## 2019-02-11 DIAGNOSIS — G8929 Other chronic pain: Secondary | ICD-10-CM | POA: Diagnosis not present

## 2019-02-14 DIAGNOSIS — R05 Cough: Secondary | ICD-10-CM | POA: Diagnosis not present

## 2019-02-15 ENCOUNTER — Encounter: Payer: Medicare Other | Admitting: *Deleted

## 2019-02-26 ENCOUNTER — Inpatient Hospital Stay (HOSPITAL_COMMUNITY)
Admission: EM | Admit: 2019-02-26 | Discharge: 2019-02-28 | DRG: 155 | Disposition: A | Discharge status: Hospice | Source: Skilled Nursing Facility | Attending: Internal Medicine | Admitting: Internal Medicine

## 2019-02-26 ENCOUNTER — Encounter (HOSPITAL_COMMUNITY): Payer: Self-pay

## 2019-02-26 ENCOUNTER — Emergency Department (HOSPITAL_COMMUNITY)

## 2019-02-26 DIAGNOSIS — Z515 Encounter for palliative care: Secondary | ICD-10-CM | POA: Diagnosis present

## 2019-02-26 DIAGNOSIS — D649 Anemia, unspecified: Secondary | ICD-10-CM | POA: Diagnosis present

## 2019-02-26 DIAGNOSIS — Y92129 Unspecified place in nursing home as the place of occurrence of the external cause: Secondary | ICD-10-CM

## 2019-02-26 DIAGNOSIS — E039 Hypothyroidism, unspecified: Secondary | ICD-10-CM | POA: Diagnosis present

## 2019-02-26 DIAGNOSIS — R680 Hypothermia, not associated with low environmental temperature: Secondary | ICD-10-CM | POA: Diagnosis present

## 2019-02-26 DIAGNOSIS — W19XXXA Unspecified fall, initial encounter: Secondary | ICD-10-CM

## 2019-02-26 DIAGNOSIS — B9620 Unspecified Escherichia coli [E. coli] as the cause of diseases classified elsewhere: Secondary | ICD-10-CM | POA: Diagnosis present

## 2019-02-26 DIAGNOSIS — Z974 Presence of external hearing-aid: Secondary | ICD-10-CM

## 2019-02-26 DIAGNOSIS — W050XXA Fall from non-moving wheelchair, initial encounter: Secondary | ICD-10-CM | POA: Diagnosis present

## 2019-02-26 DIAGNOSIS — Z79899 Other long term (current) drug therapy: Secondary | ICD-10-CM

## 2019-02-26 DIAGNOSIS — S0121XA Laceration without foreign body of nose, initial encounter: Secondary | ICD-10-CM | POA: Diagnosis not present

## 2019-02-26 DIAGNOSIS — R58 Hemorrhage, not elsewhere classified: Secondary | ICD-10-CM | POA: Diagnosis not present

## 2019-02-26 DIAGNOSIS — Z950 Presence of cardiac pacemaker: Secondary | ICD-10-CM

## 2019-02-26 DIAGNOSIS — R52 Pain, unspecified: Secondary | ICD-10-CM | POA: Diagnosis not present

## 2019-02-26 DIAGNOSIS — Z20828 Contact with and (suspected) exposure to other viral communicable diseases: Secondary | ICD-10-CM | POA: Diagnosis present

## 2019-02-26 DIAGNOSIS — H9190 Unspecified hearing loss, unspecified ear: Secondary | ICD-10-CM | POA: Diagnosis present

## 2019-02-26 DIAGNOSIS — S0990XA Unspecified injury of head, initial encounter: Secondary | ICD-10-CM

## 2019-02-26 DIAGNOSIS — R404 Transient alteration of awareness: Secondary | ICD-10-CM | POA: Diagnosis not present

## 2019-02-26 DIAGNOSIS — N3000 Acute cystitis without hematuria: Secondary | ICD-10-CM | POA: Diagnosis not present

## 2019-02-26 DIAGNOSIS — N2581 Secondary hyperparathyroidism of renal origin: Secondary | ICD-10-CM | POA: Diagnosis present

## 2019-02-26 DIAGNOSIS — Z79891 Long term (current) use of opiate analgesic: Secondary | ICD-10-CM

## 2019-02-26 DIAGNOSIS — Z79890 Hormone replacement therapy (postmenopausal): Secondary | ICD-10-CM

## 2019-02-26 DIAGNOSIS — F0390 Unspecified dementia without behavioral disturbance: Secondary | ICD-10-CM | POA: Diagnosis present

## 2019-02-26 DIAGNOSIS — Z66 Do not resuscitate: Secondary | ICD-10-CM | POA: Diagnosis present

## 2019-02-26 DIAGNOSIS — I441 Atrioventricular block, second degree: Secondary | ICD-10-CM | POA: Diagnosis present

## 2019-02-26 DIAGNOSIS — E559 Vitamin D deficiency, unspecified: Secondary | ICD-10-CM | POA: Diagnosis present

## 2019-02-26 DIAGNOSIS — Z743 Need for continuous supervision: Secondary | ICD-10-CM | POA: Diagnosis not present

## 2019-02-26 DIAGNOSIS — R944 Abnormal results of kidney function studies: Secondary | ICD-10-CM | POA: Diagnosis present

## 2019-02-26 DIAGNOSIS — M810 Age-related osteoporosis without current pathological fracture: Secondary | ICD-10-CM | POA: Diagnosis present

## 2019-02-26 LAB — CBC WITH DIFFERENTIAL/PLATELET
Abs Immature Granulocytes: 0.03 10*3/uL (ref 0.00–0.07)
Basophils Absolute: 0 10*3/uL (ref 0.0–0.1)
Basophils Relative: 0 %
Eosinophils Absolute: 0.1 10*3/uL (ref 0.0–0.5)
Eosinophils Relative: 1 %
HCT: 37 % (ref 36.0–46.0)
Hemoglobin: 11.4 g/dL — ABNORMAL LOW (ref 12.0–15.0)
Immature Granulocytes: 0 %
Lymphocytes Relative: 24 %
Lymphs Abs: 2.1 10*3/uL (ref 0.7–4.0)
MCH: 26.8 pg (ref 26.0–34.0)
MCHC: 30.8 g/dL (ref 30.0–36.0)
MCV: 87.1 fL (ref 80.0–100.0)
Monocytes Absolute: 0.7 10*3/uL (ref 0.1–1.0)
Monocytes Relative: 8 %
Neutro Abs: 5.6 10*3/uL (ref 1.7–7.7)
Neutrophils Relative %: 67 %
Platelets: 176 10*3/uL (ref 150–400)
RBC: 4.25 MIL/uL (ref 3.87–5.11)
RDW: 16.8 % — ABNORMAL HIGH (ref 11.5–15.5)
WBC: 8.6 10*3/uL (ref 4.0–10.5)
nRBC: 0 % (ref 0.0–0.2)

## 2019-02-26 LAB — COMPREHENSIVE METABOLIC PANEL
ALT: 20 U/L (ref 0–44)
AST: 29 U/L (ref 15–41)
Albumin: 3.5 g/dL (ref 3.5–5.0)
Alkaline Phosphatase: 74 U/L (ref 38–126)
Anion gap: 7 (ref 5–15)
BUN: 28 mg/dL — ABNORMAL HIGH (ref 8–23)
CO2: 28 mmol/L (ref 22–32)
Calcium: 8.9 mg/dL (ref 8.9–10.3)
Chloride: 104 mmol/L (ref 98–111)
Creatinine, Ser: 0.44 mg/dL (ref 0.44–1.00)
GFR calc Af Amer: >60 mL/min (ref >60)
GFR calc non Af Amer: >60 mL/min (ref >60)
Glucose, Bld: 140 mg/dL — ABNORMAL HIGH (ref 70–99)
Potassium: 4.1 mmol/L (ref 3.5–5.1)
Sodium: 139 mmol/L (ref 135–145)
Total Bilirubin: 0.8 mg/dL (ref 0.3–1.2)
Total Protein: 6.5 g/dL (ref 6.5–8.1)

## 2019-02-26 LAB — URINALYSIS, ROUTINE W REFLEX MICROSCOPIC
Bilirubin Urine: NEGATIVE
Glucose, UA: NEGATIVE mg/dL
Hgb urine dipstick: NEGATIVE
Ketones, ur: NEGATIVE mg/dL
Leukocytes,Ua: NEGATIVE
Nitrite: POSITIVE — AB
Protein, ur: NEGATIVE mg/dL
Specific Gravity, Urine: 1.019 (ref 1.005–1.030)
pH: 5 (ref 5.0–8.0)

## 2019-02-26 LAB — LACTIC ACID, PLASMA
Lactic Acid, Venous: 2.1 mmol/L (ref 0.5–1.9)
Lactic Acid, Venous: 1.6 mmol/L (ref 0.5–1.9)

## 2019-02-26 LAB — LIPASE, BLOOD: Lipase: 17 U/L (ref 11–51)

## 2019-02-26 MED ORDER — SODIUM CHLORIDE 0.9 % IV SOLN
1.0000 g | Freq: Once | INTRAVENOUS | Status: AC
  Administered 2019-02-26: 1 g via INTRAVENOUS
  Filled 2019-02-26: qty 10

## 2019-02-26 MED ORDER — SODIUM CHLORIDE 0.9 % IV SOLN
INTRAVENOUS | Status: DC
  Administered 2019-02-27 – 2019-02-28 (×2): via INTRAVENOUS

## 2019-02-26 MED ORDER — ACETAMINOPHEN 325 MG PO TABS
650.0000 mg | ORAL_TABLET | Freq: Four times a day (QID) | ORAL | Status: DC | PRN
  Administered 2019-02-28: 650 mg via ORAL
  Filled 2019-02-26 (×2): qty 2

## 2019-02-26 MED ORDER — MAGIC MOUTHWASH
15.0000 mL | Freq: Four times a day (QID) | ORAL | Status: DC | PRN
  Filled 2019-02-26: qty 15

## 2019-02-26 MED ORDER — HYDROCODONE-ACETAMINOPHEN 5-325 MG PO TABS
2.0000 | ORAL_TABLET | Freq: Four times a day (QID) | ORAL | Status: DC | PRN
  Administered 2019-02-26 – 2019-02-27 (×2): 2 via ORAL
  Filled 2019-02-26 (×2): qty 2

## 2019-02-26 MED ORDER — VITAMIN D 25 MCG (1000 UNIT) PO TABS
2000.0000 [IU] | ORAL_TABLET | Freq: Every day | ORAL | Status: DC
  Administered 2019-02-27 – 2019-02-28 (×2): 2000 [IU] via ORAL
  Filled 2019-02-26 (×2): qty 2

## 2019-02-26 MED ORDER — LEVOTHYROXINE SODIUM 75 MCG PO TABS
75.0000 ug | ORAL_TABLET | Freq: Every day | ORAL | Status: DC
  Administered 2019-02-28: 75 ug via ORAL
  Filled 2019-02-26 (×2): qty 1

## 2019-02-26 MED ORDER — OXYCODONE HCL ER 10 MG PO T12A
10.0000 mg | EXTENDED_RELEASE_TABLET | Freq: Every day | ORAL | Status: DC
  Administered 2019-02-27 – 2019-02-28 (×2): 10 mg via ORAL
  Filled 2019-02-26 (×2): qty 1

## 2019-02-26 MED ORDER — DONEPEZIL HCL 5 MG PO TABS
5.0000 mg | ORAL_TABLET | Freq: Every day | ORAL | Status: DC
  Administered 2019-02-26: 23:00:00 5 mg via ORAL
  Filled 2019-02-26 (×3): qty 1

## 2019-02-26 MED ORDER — ACETAMINOPHEN 650 MG RE SUPP: 650.0000 mg | Freq: Four times a day (QID) | RECTAL | Status: DC | PRN

## 2019-02-26 MED ORDER — SODIUM CHLORIDE 0.9 % IV BOLUS
500.0000 mL | Freq: Once | INTRAVENOUS | Status: AC
  Administered 2019-02-26: 500 mL via INTRAVENOUS

## 2019-02-26 MED ORDER — SODIUM CHLORIDE 0.9 % IV SOLN
1.0000 g | INTRAVENOUS | Status: AC
  Administered 2019-02-27 – 2019-02-28 (×2): 1 g via INTRAVENOUS
  Filled 2019-02-26 (×2): qty 1

## 2019-02-26 NOTE — ED Notes (Signed)
Pt and belongings transported to 1335

## 2019-02-26 NOTE — ED Provider Notes (Addendum)
Emergency Department Provider Note   I have reviewed the triage vital signs and the nursing notes.   HISTORY  Chief Complaint Fall   HPI Vanessa Keller is a 85 y.o. female resents to the emergency department from the nursing facility with face injury after fall from her wheelchair.  This was apparently a witnessed fall by staff where she appeared to slump out of her wheelchair and struck the ground with her face.  She is noted to have cut to her nose and forehead according to staff there.  She had been acting in her normal state of health.  They had not recorded any fevers or noticed any other symptoms such as shortness of breath, vomiting, diarrhea.   Level 5 caveat: Dementia.    Past Medical History:  Diagnosis Date   Fall 08/29/2010   Hearing loss    wears hearing aids   Hypothyroidism    Osteoporosis    Pacemaker 04/19/2013   [facility] Assurity DR  model [id number] (serial number  [id number] ) DUAL CHAMBER   Second degree Mobitz II AV block    Secondary hyperparathyroidism ([place])    Urinary incontinence    Urinary urgency     Patient Active Problem List   Diagnosis Date Noted   Dysuria 06/26/2013   Hospital discharge follow-up 06/26/2013   Perineal rash 06/26/2013   Senile dementia, uncomplicated ([place]) [id number]   Spontaneous pneumothorax 04/27/2013   Second degree AV block 04/18/2013   Runny nose 04/18/2013   Feeling unwell 04/18/2013   Bradycardia 38 04/18/2013   AV block, 2nd degree presumed Mobitz with 2:1 conduction 04/18/2013   Hypertension 04/18/2013   Hypokalemia 04/18/2013   Memory problem 02/16/2012   Hypothyroidism 11/29/2011   Hearing loss    Medicare annual wellness visit, initial 08/16/2010   VITAMIN D DEFICIENCY 01/27/2009   SECONDARY HYPERPARATHYROIDISM 01/27/2009   VITAMIN B12 DEFICIENCY 11/05/2008   SLEEPINESS 10/30/2008   VERTIGO 11/14/2007   EAR DISORDER 06/13/2007   GENERALIZED OSTEOARTHROSIS INVOLVING  HAND 04/24/2007   URINARY URGENCY 04/24/2007   HYPOTHYROIDISM 10/20/2006   OSTEOPOROSIS 10/20/2006    Past Surgical History:  Procedure Laterality Date   APPENDECTOMY     CATARACT EXTRACTION     INSERT / REPLACE / REMOVE PACEMAKER  04/19/2013   DUAL CHAMBER DR Caryl Comes   PERMANENT PACEMAKER INSERTION  Jan. 2015   [facility] [employer] DR  model [id number] (serial number  [id number] ) dual-lead pacemaker   PERMANENT PACEMAKER INSERTION N/A 04/19/2013   Procedure: PERMANENT PACEMAKER INSERTION;  Surgeon: Coralyn Mark, MD;  Location:  [facility];  Service: Cardiovascular;  Laterality: N/A;   removal of carotid tumor  1997   left sx nonmalignant    Allergies Patient has no known allergies.  History reviewed. No pertinent family history.  Social History Social History   Tobacco Use   Smoking status: Never Smoker   Smokeless tobacco: Never Used  Substance Use Topics   Alcohol use: No   Drug use: No    Review of Systems  Level 5 caveat: Dementia  ____________________________________________   PHYSICAL EXAM:  VITAL SIGNS: Vitals:   02/26/19 1601 02/26/19 1630  BP: (!) 135/99 113/68  Pulse: 96 83  Resp: (!) 33 12  Temp: (!) 96.7 F (35.9 C)   SpO2: 99% 96%    Constitutional: Opens eyes to voice. No apparent distress but not responding verbally.  Eyes: Conjunctivae are normal. PERRL.  Head: Atraumatic. Nose: No congestion/rhinnorhea. Mouth/Throat: Mucous membranes  are moist.   Neck: No stridor.   Cardiovascular: Normal rate, regular rhythm. Good peripheral circulation. Grossly normal heart sounds.   Respiratory: Normal respiratory effort.  No retractions. Lungs CTAB. Gastrointestinal: Soft and nontender. No distention.  Musculoskeletal: No lower extremity tenderness nor edema. No gross deformities of extremities. Neurologic: Awake and alert. Following some basic commands and moving all extremities equally.  Skin:  Skin is cool to touch.  No rash noted. No  sacral wounds/ulcerations. 1 cm superficial linear abrasion to the bridge of the nose and a 1.5 cm linear abrasion to the left forehead.    ____________________________________________   LABS (all labs ordered are listed, but only abnormal results are displayed)  Labs Reviewed  COMPREHENSIVE METABOLIC PANEL - Abnormal; Notable for the following components:      Result Value   Glucose, Bld 140 (*)    BUN 28 (*)    All other components within normal limits  LACTIC ACID, PLASMA - Abnormal; Notable for the following components:   Lactic Acid, Venous 2.1 (*)    All other components within normal limits  CBC WITH DIFFERENTIAL/PLATELET - Abnormal; Notable for the following components:   Hemoglobin 11.4 (*)    RDW 16.8 (*)    All other components within normal limits  URINALYSIS, ROUTINE W REFLEX MICROSCOPIC - Abnormal; Notable for the following components:   Color, Urine AMBER (*)    APPearance HAZY (*)    Nitrite POSITIVE (*)    Bacteria, UA MANY (*)    All other components within normal limits  URINE CULTURE  CULTURE, BLOOD (ROUTINE X 2)  CULTURE, BLOOD (ROUTINE X 2)  LIPASE, BLOOD  LACTIC ACID, PLASMA   ____________________________________________  EKG   EKG Interpretation  Date/Time:  Tuesday February 26 2019 10:45:10 EST Ventricular Rate:  63 PR Interval:    QRS Duration: 159 QT Interval:  516 QTC Calculation: 529 R Axis:   -84 Text Interpretation: Sinus rhythm Ventricular premature complex Short PR interval Probable left atrial enlargement Left bundle branch block Baseline wander in lead(s) I II aVR No STEMI Confirmed by Nanda Quinton [phone]) on 02/26/2019 11:33:57 AM       ____________________________________________  RADIOLOGY  Ct Head Wo Contrast  Result Date: 02/26/2019 CLINICAL DATA:  Slid out of wheelchair onto ground.  Trauma. EXAM: CT HEAD WITHOUT CONTRAST CT MAXILLOFACIAL WITHOUT CONTRAST CT CERVICAL SPINE WITHOUT CONTRAST TECHNIQUE: Multidetector CT  imaging of the head, cervical spine, and maxillofacial structures were performed using the standard protocol without intravenous contrast. Multiplanar CT image reconstructions of the cervical spine and maxillofacial structures were also generated. COMPARISON:  None. FINDINGS: CT HEAD FINDINGS Brain: No evidence of acute infarction, hemorrhage, hydrocephalus, extra-axial collection or mass lesion/mass effect. There is mild diffuse low-attenuation within the subcortical and periventricular white matter compatible with chronic microvascular disease. Prominence of the sulci and ventricles compatible with age related brain atrophy. Vascular: No hyperdense vessel or unexpected calcification. Skull: Normal. Negative for fracture or focal lesion. Other: None. CT MAXILLOFACIAL FINDINGS Osseous: No fracture or mandibular dislocation. No destructive process. Orbits: Negative. No traumatic or inflammatory finding. Sinuses: The paranasal sinuses and the mastoid air cells are well aerated. Soft tissues: Soft tissue laceration is identified along the left side of nodes. CT CERVICAL SPINE FINDINGS Alignment: Normal. Skull base and vertebrae: No acute fracture. No primary bone lesion or focal pathologic process. Soft tissues and spinal canal: No prevertebral fluid or swelling. No visible canal hematoma. Disc levels: Multi level facet hypertrophy and degenerative changes  noted throughout the cervical spine, mild multilevel disc space narrowing and endplate spurring noted. This is most advanced at C5-6. Upper chest: Negative. Other: None IMPRESSION: 1. No acute intracranial abnormalities. 2. Chronic small vessel ischemic disease and brain atrophy. 3. No facial bone fracture. Laceration is noted along the left side of nodes. 4. No cervical spine fracture or dislocation. 5. Cervical degenerative disc disease. Electronically Signed   By: Kerby Moors M.D.   On: 02/26/2019 12:02   Ct Cervical Spine Wo Contrast  Result Date:  02/26/2019 CLINICAL DATA:  Slid out of wheelchair onto ground.  Trauma. EXAM: CT HEAD WITHOUT CONTRAST CT MAXILLOFACIAL WITHOUT CONTRAST CT CERVICAL SPINE WITHOUT CONTRAST TECHNIQUE: Multidetector CT imaging of the head, cervical spine, and maxillofacial structures were performed using the standard protocol without intravenous contrast. Multiplanar CT image reconstructions of the cervical spine and maxillofacial structures were also generated. COMPARISON:  None. FINDINGS: CT HEAD FINDINGS Brain: No evidence of acute infarction, hemorrhage, hydrocephalus, extra-axial collection or mass lesion/mass effect. There is mild diffuse low-attenuation within the subcortical and periventricular white matter compatible with chronic microvascular disease. Prominence of the sulci and ventricles compatible with age related brain atrophy. Vascular: No hyperdense vessel or unexpected calcification. Skull: Normal. Negative for fracture or focal lesion. Other: None. CT MAXILLOFACIAL FINDINGS Osseous: No fracture or mandibular dislocation. No destructive process. Orbits: Negative. No traumatic or inflammatory finding. Sinuses: The paranasal sinuses and the mastoid air cells are well aerated. Soft tissues: Soft tissue laceration is identified along the left side of nodes. CT CERVICAL SPINE FINDINGS Alignment: Normal. Skull base and vertebrae: No acute fracture. No primary bone lesion or focal pathologic process. Soft tissues and spinal canal: No prevertebral fluid or swelling. No visible canal hematoma. Disc levels: Multi level facet hypertrophy and degenerative changes noted throughout the cervical spine, mild multilevel disc space narrowing and endplate spurring noted. This is most advanced at C5-6. Upper chest: Negative. Other: None IMPRESSION: 1. No acute intracranial abnormalities. 2. Chronic small vessel ischemic disease and brain atrophy. 3. No facial bone fracture. Laceration is noted along the left side of nodes. 4. No  cervical spine fracture or dislocation. 5. Cervical degenerative disc disease. Electronically Signed   By: Kerby Moors M.D.   On: 02/26/2019 12:02   Dg Chest Portable 1 View  Result Date: 02/26/2019 CLINICAL DATA:  Hypothermia EXAM: PORTABLE CHEST 1 VIEW COMPARISON:  April 22, 2013 FINDINGS: There is mild atelectasis in the left base. There is no edema or consolidation. Heart is upper normal in size with pulmonary vascularity normal. Pacemaker leads are attached to the right atrium and right ventricle. There is aortic atherosclerosis. Bones are osteoporotic. IMPRESSION: Mild left base atelectasis. No edema or consolidation. Stable cardiac silhouette. Pacemaker leads attached to right atrium and right ventricle. Bones osteoporotic. Aortic Atherosclerosis (ICD10-I70.0). Electronically Signed   By: Lowella Grip III M.D.   On: 02/26/2019 11:52   Ct Maxillofacial Wo Contrast  Result Date: 02/26/2019 CLINICAL DATA:  Slid out of wheelchair onto ground.  Trauma. EXAM: CT HEAD WITHOUT CONTRAST CT MAXILLOFACIAL WITHOUT CONTRAST CT CERVICAL SPINE WITHOUT CONTRAST TECHNIQUE: Multidetector CT imaging of the head, cervical spine, and maxillofacial structures were performed using the standard protocol without intravenous contrast. Multiplanar CT image reconstructions of the cervical spine and maxillofacial structures were also generated. COMPARISON:  None. FINDINGS: CT HEAD FINDINGS Brain: No evidence of acute infarction, hemorrhage, hydrocephalus, extra-axial collection or mass lesion/mass effect. There is mild diffuse low-attenuation within the subcortical and periventricular white matter  compatible with chronic microvascular disease. Prominence of the sulci and ventricles compatible with age related brain atrophy. Vascular: No hyperdense vessel or unexpected calcification. Skull: Normal. Negative for fracture or focal lesion. Other: None. CT MAXILLOFACIAL FINDINGS Osseous: No fracture or mandibular  dislocation. No destructive process. Orbits: Negative. No traumatic or inflammatory finding. Sinuses: The paranasal sinuses and the mastoid air cells are well aerated. Soft tissues: Soft tissue laceration is identified along the left side of nodes. CT CERVICAL SPINE FINDINGS Alignment: Normal. Skull base and vertebrae: No acute fracture. No primary bone lesion or focal pathologic process. Soft tissues and spinal canal: No prevertebral fluid or swelling. No visible canal hematoma. Disc levels: Multi level facet hypertrophy and degenerative changes noted throughout the cervical spine, mild multilevel disc space narrowing and endplate spurring noted. This is most advanced at C5-6. Upper chest: Negative. Other: None IMPRESSION: 1. No acute intracranial abnormalities. 2. Chronic small vessel ischemic disease and brain atrophy. 3. No facial bone fracture. Laceration is noted along the left side of nodes. 4. No cervical spine fracture or dislocation. 5. Cervical degenerative disc disease. Electronically Signed   By: Kerby Moors M.D.   On: 02/26/2019 12:02    ____________________________________________   PROCEDURES  Procedure(s) performed:   Marland KitchenMarland KitchenLaceration Repair  Date/Time: 02/26/2019 1:47 PM Performed by: Margette Fast, MD Authorized by: Margette Fast, MD   Consent:    Consent obtained: POA.   Consent given by:  Healthcare agent   Alternatives discussed:  No treatment Anesthesia (see MAR for exact dosages):    Anesthesia method:  None Laceration details:    Location:  Face   Face location:  Nose   Length (cm):  1 Repair type:    Repair type:  Simple Pre-procedure details:    Preparation:  Imaging obtained to evaluate for foreign bodies Exploration:    Hemostasis achieved with:  Direct pressure   Wound exploration: entire depth of wound probed and visualized     Wound extent: no nerve damage noted, no underlying fracture noted and no vascular damage noted     Contaminated: no     Treatment:    Area cleansed with:  Betadine   Amount of cleaning:  Standard   Visualized foreign bodies/material removed: no   Skin repair:    Repair method:  Tissue adhesive Approximation:    Approximation:  Close Post-procedure details:    Dressing:  Open (no dressing)   Patient tolerance of procedure:  Tolerated well, no immediate complications  .Critical Care Performed by: Margette Fast, MD Authorized by: Margette Fast, MD   Critical care provider statement:    Critical care time (minutes):  35   Critical care time was exclusive of:  Separately billable procedures and treating other patients and teaching time   Critical care was necessary to treat or prevent imminent or life-threatening deterioration of the following conditions:  Sepsis and CNS failure or compromise   Critical care was time spent personally by me on the following activities:  Blood draw for specimens, development of treatment plan with patient or surrogate, discussions with consultants, evaluation of patient's response to treatment, examination of patient, obtaining history from patient or surrogate, ordering and performing treatments and interventions, ordering and review of laboratory studies, ordering and review of radiographic studies, pulse oximetry, re-evaluation of patient's condition and review of old charts   I assumed direction of critical care for this patient from another provider in my specialty: no  ____________________________________________   INITIAL IMPRESSION / ASSESSMENT AND PLAN / ED COURSE  Pertinent labs & imaging results that were available during my care of the patient were reviewed by me and considered in my medical decision making (see chart for details).   Patient presents to the emergency department by EMS after fall from her wheelchair.  On initial assessment the patient was found to have a rectal temp of 93 degrees F.  The fall was witnessed and she does have 2 small linear  abrasions as noted above that will likely not require suture.  Will obtain CT head, face, C-spine in the setting of trauma.  I will also perform infection type work-up given the patient's low core temp. urine is foul-smelling according to nursing staff after changing the patient shortly after arrival. DNR form at bedside.   Discussed case with Vanessa Keller, the patient's POA, by phone. Advises that patient would want abx and supportive care in this setting. Labs with UTI noted. CT reviewed and laceration to the nose repaired with dermabond after cleaning. Forehead laceration is very superficial and I do not think that this would hold suture well. Allow to heal by secondary intention.   Discussed patient's case with [facility] to request admission. Patient and family (if present) updated with plan. Care transferred to [facility] service.  I reviewed all nursing notes, vitals, pertinent old records, EKGs, labs, imaging (as available).  ____________________________________________  FINAL CLINICAL IMPRESSION(S) / ED DIAGNOSES  Final diagnoses:  Acute cystitis without hematuria  Fall, initial encounter  Injury of head, initial encounter  Laceration of nose, initial encounter     MEDICATIONS GIVEN DURING THIS VISIT:  Medications  sodium chloride 0.9 % bolus 500 mL (has no administration in time range)  cefTRIAXone (ROCEPHIN) 1 g in sodium chloride 0.9 % 100 mL IVPB (1 g Intravenous New Bag/Given 02/26/19 1215)    Note:  This document was prepared using Dragon voice recognition software and may include unintentional dictation errors.  Nanda Quinton, MD, [facility] Emergency Medicine    Natahlia Hoggard, [facility], MD 02/26/19 1646    Margette Fast, MD 02/26/19 [phone]

## 2019-02-26 NOTE — ED Notes (Signed)
MD made aware of rectal temp 93.3 and laceration on nose bleeding

## 2019-02-26 NOTE — ED Notes (Signed)
CRITICAL VALUE STICKER  CRITICAL VALUE: Lacitc 2.1  RECEIVER (on-site recipient of call):  DATE & TIME NOTIFIED: 11/17 1205  MESSENGER (representative from lab):  MD NOTIFIED: Long MD  TIME OF NOTIFICATION: 1205  RESPONSE: Noted

## 2019-02-26 NOTE — ED Notes (Signed)
Pt returned from [place] in place

## 2019-02-26 NOTE — ED Notes (Signed)
Dermabond placed at bedside. 

## 2019-02-26 NOTE — ED Notes (Signed)
Pt transported to CT ?

## 2019-02-26 NOTE — ED Triage Notes (Signed)
Patient arrived via [facility] from [place].   Patient slid out of her wheelchair onto the ground.   C/O facial trauma  Laceration on nose and left side of forehead.   Patient given tramadol from facility.    A/O per normal.   Denies passing out per staff Denies blood thinner per staff  C-collar on by ems    Hx. Dementia   112/72 P-72 RR-16 97.4

## 2019-02-26 NOTE — ED Notes (Signed)
Pt able to swallow water w/o issue. Will provide applesauce to take oral medications.

## 2019-02-26 NOTE — H&P (Addendum)
History and Physical    Vanessa Keller [id number] DOB: 01-17-1916 DOA: 02/26/2019  PCP: Dion Body, NP Patient coming from: Nursing facility  I have personally briefly reviewed patient's old medical records in [place]  Chief Complaint: fall  HPI: Vanessa Keller is a 85 y.o. female with medical history significant of Hypothyroidism, Osteoporosis, heart block s/p ppm, falls, advanced dementia who has been on hospice who presented today after fall at her nursing facility.  Per facility- patient resides at [facility], patient had slid out of her wheelchair and onto the ground, sustained facial trauma and with a lacertion to her nose and the L. Side of her forehead.  They gave her tramadol and she was then transported via EMS.  The patient is currently under bair hugger and is unable to verbalize any complaints.  She appears frail and injuries sustained are notable.  Spoke with Hospice care, they report have that patient has been enrolled in hospice since July 30.  Dr. Laverta Baltimore spoke with [name] who is the patient's designated power of attorney and he was unaware of her status of hospice (is a laywer) and patient does not have family listed.  I spoke with hospice agency, they state she has a POA and HPOA and Sharlee Blew [phone] - Home, [phone]- Cell).  They state her husband and son have passed away.  On her form she does not wish to be hospitalized, have tube feeds, is DNR/DNI.  Discussed current findings with the hospice agency and stated given fall and laceration would provide short term care to address this and antibiotics, but given previously stated wishes would try and return her to facility after she has been rewarmed and would give antibiotics while here.  The goal would remain comfort and to avoid any escalation of care.    I spoke with Sharlee Blew, patient's Healthcare POA, and she felt in agreement it was appropriate to monitor her overnight here, allow patient to  receive antibiotics and rewarm as planned without escalation of care.   Review of Systems: As per HPI otherwise 10 point review of systems negative.    Past Medical History:  Diagnosis Date   Fall 08/29/2010   Hearing loss    wears hearing aids   Hypothyroidism    Osteoporosis    Pacemaker 04/19/2013   [facility] [employer] DR  model [phone] (serial number  [id number] ) DUAL CHAMBER   Second degree Mobitz II AV block    Secondary hyperparathyroidism ([place])    Urinary incontinence    Urinary urgency     Past Surgical History:  Procedure Laterality Date   APPENDECTOMY     CATARACT EXTRACTION     INSERT / REPLACE / REMOVE PACEMAKER  04/19/2013   DUAL CHAMBER DR Caryl Comes   PERMANENT PACEMAKER INSERTION  Jan. 2015   [facility] [employer] Assurity DR  model [id number] (serial number  [id number] ) dual-lead pacemaker   PERMANENT PACEMAKER INSERTION N/A 04/19/2013   Procedure: PERMANENT PACEMAKER INSERTION;  Surgeon: Coralyn Mark, MD;  Location: [place] CATH LAB;  Service: Cardiovascular;  Laterality: N/A;   removal of carotid tumor  1997   left sx nonmalignant     reports that she has never smoked. She has never used smokeless tobacco. She reports that she does not drink alcohol or use drugs.  No Known Allergies  History reviewed. No pertinent family history.   Prior to Admission medications   Medication Sig Start Date End Date Taking?  Authorizing Provider  Cholecalciferol (VITAMIN D) 2000 UNITS tablet Take 2,000 Units by mouth daily.    [provider]  donepezil (ARICEPT ODT) 5 MG disintegrating tablet Take 1 tablet (5 mg total) by mouth at bedtime. 06/26/13   Panosh, Standley Brooking, MD  HYDROcodone-acetaminophen (NORCO/VICODIN) 5-325 MG tablet Take 2 tablets by mouth every 6 (six) hours as needed. pain 05/01/15   [provider]  levothyroxine (SYNTHROID, LEVOTHROID) 75 MCG tablet Take 1 tablet (75 mcg total) by mouth daily before breakfast. 06/26/13   Panosh, Standley Brooking, MD    oxyCODONE (OXYCONTIN) 10 mg 12 hr tablet Take 10 mg by mouth every morning.    [provider]    Physical Exam: Vitals:   02/26/19 1228 02/26/19 1230 02/26/19 1316 02/26/19 1330  BP: 115/60 105/68 (!) 103/49 112/60  Pulse: (!) 59 63 68 65  Resp: 17 19 19 19   Temp:      TempSrc:      SpO2: 100% 100% 99% 98%    Constitutional: NAD, calm, comfortable Vitals:   02/26/19 1228 02/26/19 1230 02/26/19 1316 02/26/19 1330  BP: 115/60 105/68 (!) 103/49 112/60  Pulse: (!) 59 63 68 65  Resp: 17 19 19 19   Temp:      TempSrc:      SpO2: 100% 100% 99% 98%   General: very frail appearing, lacerations to face immediately noticeable, under bair hugger Eyes: tracks, slow to open ENMT: dry MM  Neck: normal, supple, no masses, no thyromegaly Respiratory: clear to auscultation bilaterally, no wheezing, no crackles.  Cardiovascular: pacemaker visible, Regular rate and rhythm, no murmurs / rubs / gallops. No extremity edema. 2+ pedal pulses. No carotid bruits.  Abdomen: no tenderness, no masses palpated. No hepatosplenomegaly. Bowel sounds positive.  Musculoskeletal: no clubbing / cyanosis. No joint deformity upper and lower extremities. Passive ROM intact Skin: laceration to nose and L. Forehead visible Neurologic: limited exam due to patient condition Psychiatric: Normal judgment and insight. Alert and oriented x 3. Normal mood.   Labs on Admission: I have personally reviewed following labs and imaging studies  CBC: Recent Labs  Lab 02/26/19 1039  WBC 8.6  NEUTROABS 5.6  HGB 11.4*  HCT 37.0  MCV 87.1  PLT 0000000   Basic Metabolic Panel: Recent Labs  Lab 02/26/19 1039  NA 139  K 4.1  CL 104  CO2 28  GLUCOSE 140*  BUN 28*  CREATININE 0.44  CALCIUM 8.9   GFR: CrCl cannot be calculated (Unknown ideal weight.). Liver Function Tests: Recent Labs  Lab 02/26/19 1039  AST 29  ALT 20  ALKPHOS 74  BILITOT 0.8  PROT 6.5  ALBUMIN 3.5   Recent Labs  Lab 02/26/19 1039   LIPASE 17   No results for input(s): AMMONIA in the last 168 hours. Coagulation Profile: No results for input(s): INR, PROTIME in the last 168 hours. Cardiac Enzymes: No results for input(s): CKTOTAL, CKMB, CKMBINDEX, TROPONINI in the last 168 hours. BNP (last 3 results) No results for input(s): PROBNP in the last 8760 hours. HbA1C: No results for input(s): HGBA1C in the last 72 hours. CBG: No results for input(s): GLUCAP in the last 168 hours. Lipid Profile: No results for input(s): CHOL, HDL, LDLCALC, TRIG, CHOLHDL, LDLDIRECT in the last 72 hours. Thyroid Function Tests: No results for input(s): TSH, T4TOTAL, FREET4, T3FREE, THYROIDAB in the last 72 hours. Anemia Panel: No results for input(s): VITAMINB12, FOLATE, FERRITIN, TIBC, IRON, RETICCTPCT in the last 72 hours. Urine analysis:  Component Value Date/Time   COLORURINE AMBER (A) 02/26/2019 1039   APPEARANCEUR HAZY (A) 02/26/2019 1039   LABSPEC 1.019 02/26/2019 1039   PHURINE 5.0 02/26/2019 1039   GLUCOSEU NEGATIVE 02/26/2019 1039   HGBUR NEGATIVE 02/26/2019 [place] 02/26/2019 1039   BILIRUBINUR Neg 06/26/2013 1522   KETONESUR NEGATIVE 02/26/2019 1039   PROTEINUR NEGATIVE 02/26/2019 1039   UROBILINOGEN 0.2 06/26/2013 1522   NITRITE POSITIVE (A) 02/26/2019 1039   LEUKOCYTESUR NEGATIVE 02/26/2019 1039    Radiological Exams on Admission: Ct Head Wo Contrast  Result Date: 02/26/2019 CLINICAL DATA:  Slid out of wheelchair onto ground.  Trauma. EXAM: CT HEAD WITHOUT CONTRAST CT MAXILLOFACIAL WITHOUT CONTRAST CT CERVICAL SPINE WITHOUT CONTRAST TECHNIQUE: Multidetector CT imaging of the head, cervical spine, and maxillofacial structures were performed using the standard protocol without intravenous contrast. Multiplanar CT image reconstructions of the cervical spine and maxillofacial structures were also generated. COMPARISON:  None. FINDINGS: CT HEAD FINDINGS Brain: No evidence of acute infarction,  hemorrhage, hydrocephalus, extra-axial collection or mass lesion/mass effect. There is mild diffuse low-attenuation within the subcortical and periventricular white matter compatible with chronic microvascular disease. Prominence of the sulci and ventricles compatible with age related brain atrophy. Vascular: No hyperdense vessel or unexpected calcification. Skull: Normal. Negative for fracture or focal lesion. Other: None. CT MAXILLOFACIAL FINDINGS Osseous: No fracture or mandibular dislocation. No destructive process. Orbits: Negative. No traumatic or inflammatory finding. Sinuses: The paranasal sinuses and the mastoid air cells are well aerated. Soft tissues: Soft tissue laceration is identified along the left side of nodes. CT CERVICAL SPINE FINDINGS Alignment: Normal. Skull base and vertebrae: No acute fracture. No primary bone lesion or focal pathologic process. Soft tissues and spinal canal: No prevertebral fluid or swelling. No visible canal hematoma. Disc levels: Multi level facet hypertrophy and degenerative changes noted throughout the cervical spine, mild multilevel disc space narrowing and endplate spurring noted. This is most advanced at C5-6. Upper chest: Negative. Other: None IMPRESSION: 1. No acute intracranial abnormalities. 2. Chronic small vessel ischemic disease and brain atrophy. 3. No facial bone fracture. Laceration is noted along the left side of nodes. 4. No cervical spine fracture or dislocation. 5. Cervical degenerative disc disease. Electronically Signed   By: Kerby Moors M.D.   On: 02/26/2019 12:02   Ct Cervical Spine Wo Contrast  Result Date: 02/26/2019 CLINICAL DATA:  Slid out of wheelchair onto ground.  Trauma. EXAM: CT HEAD WITHOUT CONTRAST CT MAXILLOFACIAL WITHOUT CONTRAST CT CERVICAL SPINE WITHOUT CONTRAST TECHNIQUE: Multidetector CT imaging of the head, cervical spine, and maxillofacial structures were performed using the standard protocol without intravenous contrast.  Multiplanar CT image reconstructions of the cervical spine and maxillofacial structures were also generated. COMPARISON:  None. FINDINGS: CT HEAD FINDINGS Brain: No evidence of acute infarction, hemorrhage, hydrocephalus, extra-axial collection or mass lesion/mass effect. There is mild diffuse low-attenuation within the subcortical and periventricular white matter compatible with chronic microvascular disease. Prominence of the sulci and ventricles compatible with age related brain atrophy. Vascular: No hyperdense vessel or unexpected calcification. Skull: Normal. Negative for fracture or focal lesion. Other: None. CT MAXILLOFACIAL FINDINGS Osseous: No fracture or mandibular dislocation. No destructive process. Orbits: Negative. No traumatic or inflammatory finding. Sinuses: The paranasal sinuses and the mastoid air cells are well aerated. Soft tissues: Soft tissue laceration is identified along the left side of nodes. CT CERVICAL SPINE FINDINGS Alignment: Normal. Skull base and vertebrae: No acute fracture. No primary bone lesion or focal pathologic process. Soft  tissues and spinal canal: No prevertebral fluid or swelling. No visible canal hematoma. Disc levels: Multi level facet hypertrophy and degenerative changes noted throughout the cervical spine, mild multilevel disc space narrowing and endplate spurring noted. This is most advanced at C5-6. Upper chest: Negative. Other: None IMPRESSION: 1. No acute intracranial abnormalities. 2. Chronic small vessel ischemic disease and brain atrophy. 3. No facial bone fracture. Laceration is noted along the left side of nodes. 4. No cervical spine fracture or dislocation. 5. Cervical degenerative disc disease. Electronically Signed   By: Kerby Moors M.D.   On: 02/26/2019 12:02   Dg Chest Portable 1 View  Result Date: 02/26/2019 CLINICAL DATA:  Hypothermia EXAM: PORTABLE CHEST 1 VIEW COMPARISON:  April 22, 2013 FINDINGS: There is mild atelectasis in the left base.  There is no edema or consolidation. Heart is upper normal in size with pulmonary vascularity normal. Pacemaker leads are attached to the right atrium and right ventricle. There is aortic atherosclerosis. Bones are osteoporotic. IMPRESSION: Mild left base atelectasis. No edema or consolidation. Stable cardiac silhouette. Pacemaker leads attached to right atrium and right ventricle. Bones osteoporotic. Aortic Atherosclerosis (ICD10-I70.0). Electronically Signed   By: Lowella Grip III M.D.   On: 02/26/2019 11:52   Ct Maxillofacial Wo Contrast  Result Date: 02/26/2019 CLINICAL DATA:  Slid out of wheelchair onto ground.  Trauma. EXAM: CT HEAD WITHOUT CONTRAST CT MAXILLOFACIAL WITHOUT CONTRAST CT CERVICAL SPINE WITHOUT CONTRAST TECHNIQUE: Multidetector CT imaging of the head, cervical spine, and maxillofacial structures were performed using the standard protocol without intravenous contrast. Multiplanar CT image reconstructions of the cervical spine and maxillofacial structures were also generated. COMPARISON:  None. FINDINGS: CT HEAD FINDINGS Brain: No evidence of acute infarction, hemorrhage, hydrocephalus, extra-axial collection or mass lesion/mass effect. There is mild diffuse low-attenuation within the subcortical and periventricular white matter compatible with chronic microvascular disease. Prominence of the sulci and ventricles compatible with age related brain atrophy. Vascular: No hyperdense vessel or unexpected calcification. Skull: Normal. Negative for fracture or focal lesion. Other: None. CT MAXILLOFACIAL FINDINGS Osseous: No fracture or mandibular dislocation. No destructive process. Orbits: Negative. No traumatic or inflammatory finding. Sinuses: The paranasal sinuses and the mastoid air cells are well aerated. Soft tissues: Soft tissue laceration is identified along the left side of nodes. CT CERVICAL SPINE FINDINGS Alignment: Normal. Skull base and vertebrae: No acute fracture. No primary bone  lesion or focal pathologic process. Soft tissues and spinal canal: No prevertebral fluid or swelling. No visible canal hematoma. Disc levels: Multi level facet hypertrophy and degenerative changes noted throughout the cervical spine, mild multilevel disc space narrowing and endplate spurring noted. This is most advanced at C5-6. Upper chest: Negative. Other: None IMPRESSION: 1. No acute intracranial abnormalities. 2. Chronic small vessel ischemic disease and brain atrophy. 3. No facial bone fracture. Laceration is noted along the left side of nodes. 4. No cervical spine fracture or dislocation. 5. Cervical degenerative disc disease. Electronically Signed   By: Kerby Moors M.D.   On: 02/26/2019 12:02    EKG: Independently reviewed.   Assessment/Plan INES TOENNIES is a 85 y.o. female with medical history significant of Hypothyroidism, Osteoporosis, heart block s/p ppm, falls, advanced dementia who has been on hospice who presented today after fall at her nursing facility noted to have facial lacerations and hypothermia in setting of possible UTI.  # Fall - patient appears to have simply slid out of her wheelchair and in setting of her advanced age and dementia, sustained facial  trauma.  She has lacerations but no intracranial abnormalities on CTH,  nor other acute findings on survey C-spine or maxillofacial CT - continue supportive care and pain control  # Hypothermia # UTI - patient may have infection contributing to this though obtained labs are largely unremarkable; patient has in her MOST that she would not want hospitalizations.  I have discussed with Vanessa Keller, Healthcare power of attorney regarding request for admission and that we would not escalate care but provide supportive care with rewarming and Rx possible UTI if it were contributing but would otherwise focus on comfort measures.  # Advanced age and Dementia # Hypothyroidism # Osteoporosis # Heart block s/p PPM - continued pta  donepezil, levothyroxine and Vit D (as tolerated) - continue comfort measures and anticipate return to nursing home tomorrow.  Hospice agency updated of plan.   DVT prophylaxis: SCDs Code Status: DNR/DNI - comfort measures Family Communication: Vanessa Keller - patient's healthcare POA [phone] - Home, [phone]- Cell) Disposition Plan: return to facility in AM to continue hospice care Consults called: Spoke with Hospice agency - [phone] Admission status: Obs   Truddie Hidden MD [facility] Pager [phone]  If 7PM-7AM, please contact night-coverage [email or web address] Password [email or web address]  02/26/2019, 2:17 PM

## 2019-02-26 NOTE — ED Notes (Signed)
This RN called report and floor RN requested to call back in a few minutes

## 2019-02-26 NOTE — ED Notes (Signed)
Pt temp 99.2 F rectally hugger removed

## 2019-02-26 NOTE — ED Notes (Signed)
Urine 1 occurrence

## 2019-02-26 NOTE — ED Notes (Signed)
X-ray at bedside

## 2019-02-27 ENCOUNTER — Other Ambulatory Visit: Payer: Self-pay

## 2019-02-27 DIAGNOSIS — Z515 Encounter for palliative care: Secondary | ICD-10-CM | POA: Diagnosis present

## 2019-02-27 DIAGNOSIS — R944 Abnormal results of kidney function studies: Secondary | ICD-10-CM | POA: Diagnosis present

## 2019-02-27 DIAGNOSIS — Z79891 Long term (current) use of opiate analgesic: Secondary | ICD-10-CM | POA: Diagnosis not present

## 2019-02-27 DIAGNOSIS — W050XXA Fall from non-moving wheelchair, initial encounter: Secondary | ICD-10-CM | POA: Diagnosis present

## 2019-02-27 DIAGNOSIS — Z20828 Contact with and (suspected) exposure to other viral communicable diseases: Secondary | ICD-10-CM | POA: Diagnosis present

## 2019-02-27 DIAGNOSIS — Z950 Presence of cardiac pacemaker: Secondary | ICD-10-CM | POA: Diagnosis not present

## 2019-02-27 DIAGNOSIS — W19XXXA Unspecified fall, initial encounter: Secondary | ICD-10-CM | POA: Diagnosis not present

## 2019-02-27 DIAGNOSIS — R680 Hypothermia, not associated with low environmental temperature: Secondary | ICD-10-CM | POA: Diagnosis present

## 2019-02-27 DIAGNOSIS — S0121XA Laceration without foreign body of nose, initial encounter: Secondary | ICD-10-CM | POA: Diagnosis present

## 2019-02-27 DIAGNOSIS — N2581 Secondary hyperparathyroidism of renal origin: Secondary | ICD-10-CM | POA: Diagnosis present

## 2019-02-27 DIAGNOSIS — Z79890 Hormone replacement therapy (postmenopausal): Secondary | ICD-10-CM | POA: Diagnosis not present

## 2019-02-27 DIAGNOSIS — Y92129 Unspecified place in nursing home as the place of occurrence of the external cause: Secondary | ICD-10-CM | POA: Diagnosis not present

## 2019-02-27 DIAGNOSIS — E559 Vitamin D deficiency, unspecified: Secondary | ICD-10-CM | POA: Diagnosis present

## 2019-02-27 DIAGNOSIS — E039 Hypothyroidism, unspecified: Secondary | ICD-10-CM

## 2019-02-27 DIAGNOSIS — Z79899 Other long term (current) drug therapy: Secondary | ICD-10-CM | POA: Diagnosis not present

## 2019-02-27 DIAGNOSIS — N390 Urinary tract infection, site not specified: Secondary | ICD-10-CM

## 2019-02-27 DIAGNOSIS — Z743 Need for continuous supervision: Secondary | ICD-10-CM | POA: Diagnosis not present

## 2019-02-27 DIAGNOSIS — B9620 Unspecified Escherichia coli [E. coli] as the cause of diseases classified elsewhere: Secondary | ICD-10-CM

## 2019-02-27 DIAGNOSIS — M810 Age-related osteoporosis without current pathological fracture: Secondary | ICD-10-CM | POA: Diagnosis present

## 2019-02-27 DIAGNOSIS — D649 Anemia, unspecified: Secondary | ICD-10-CM | POA: Diagnosis not present

## 2019-02-27 DIAGNOSIS — I441 Atrioventricular block, second degree: Secondary | ICD-10-CM | POA: Diagnosis present

## 2019-02-27 DIAGNOSIS — N3000 Acute cystitis without hematuria: Secondary | ICD-10-CM | POA: Diagnosis present

## 2019-02-27 DIAGNOSIS — R279 Unspecified lack of coordination: Secondary | ICD-10-CM | POA: Diagnosis not present

## 2019-02-27 DIAGNOSIS — F0390 Unspecified dementia without behavioral disturbance: Secondary | ICD-10-CM | POA: Diagnosis present

## 2019-02-27 DIAGNOSIS — H9190 Unspecified hearing loss, unspecified ear: Secondary | ICD-10-CM | POA: Diagnosis present

## 2019-02-27 DIAGNOSIS — Z66 Do not resuscitate: Secondary | ICD-10-CM | POA: Diagnosis present

## 2019-02-27 DIAGNOSIS — Z974 Presence of external hearing-aid: Secondary | ICD-10-CM | POA: Diagnosis not present

## 2019-02-27 LAB — COMPREHENSIVE METABOLIC PANEL
ALT: 18 U/L (ref 0–44)
AST: 24 U/L (ref 15–41)
Albumin: 3.1 g/dL — ABNORMAL LOW (ref 3.5–5.0)
Alkaline Phosphatase: 70 U/L (ref 38–126)
Anion gap: 7 (ref 5–15)
BUN: 29 mg/dL — ABNORMAL HIGH (ref 8–23)
CO2: 25 mmol/L (ref 22–32)
Calcium: 8.4 mg/dL — ABNORMAL LOW (ref 8.9–10.3)
Chloride: 107 mmol/L (ref 98–111)
Creatinine, Ser: 0.77 mg/dL (ref 0.44–1.00)
GFR calc Af Amer: >60 mL/min (ref >60)
GFR calc non Af Amer: >60 mL/min (ref >60)
Glucose, Bld: 105 mg/dL — ABNORMAL HIGH (ref 70–99)
Potassium: 3.7 mmol/L (ref 3.5–5.1)
Sodium: 139 mmol/L (ref 135–145)
Total Bilirubin: 0.8 mg/dL (ref 0.3–1.2)
Total Protein: 5.9 g/dL — ABNORMAL LOW (ref 6.5–8.1)

## 2019-02-27 LAB — SARS CORONAVIRUS 2 (TAT 6-24 HRS)
SARS Coronavirus 2: NEGATIVE
SARS Coronavirus 2: NEGATIVE

## 2019-02-27 LAB — CBC WITH DIFFERENTIAL/PLATELET
Abs Immature Granulocytes: 0.04 10*3/uL (ref 0.00–0.07)
Basophils Absolute: 0 10*3/uL (ref 0.0–0.1)
Basophils Relative: 0 %
Eosinophils Absolute: 0 10*3/uL (ref 0.0–0.5)
Eosinophils Relative: 0 %
HCT: 34.5 % — ABNORMAL LOW (ref 36.0–46.0)
Hemoglobin: 10.9 g/dL — ABNORMAL LOW (ref 12.0–15.0)
Immature Granulocytes: 0 %
Lymphocytes Relative: 27 %
Lymphs Abs: 2.5 10*3/uL (ref 0.7–4.0)
MCH: 27.2 pg (ref 26.0–34.0)
MCHC: 31.6 g/dL (ref 30.0–36.0)
MCV: 86 fL (ref 80.0–100.0)
Monocytes Absolute: 1 10*3/uL (ref 0.1–1.0)
Monocytes Relative: 11 %
Neutro Abs: 5.6 10*3/uL (ref 1.7–7.7)
Neutrophils Relative %: 62 %
Platelets: 177 10*3/uL (ref 150–400)
RBC: 4.01 MIL/uL (ref 3.87–5.11)
RDW: 17.1 % — ABNORMAL HIGH (ref 11.5–15.5)
WBC: 9.3 10*3/uL (ref 4.0–10.5)
nRBC: 0 % (ref 0.0–0.2)

## 2019-02-27 LAB — PHOSPHORUS: Phosphorus: 3.9 mg/dL (ref 2.5–4.6)

## 2019-02-27 LAB — MAGNESIUM: Magnesium: 2.2 mg/dL (ref 1.7–2.4)

## 2019-02-27 NOTE — Plan of Care (Signed)
  Problem: Coping: Goal: Level of anxiety will decrease Outcome: Progressing   Problem: Elimination: Goal: Will not experience complications related to bowel motility Outcome: Progressing   Problem: Nutrition: Goal: Adequate nutrition will be maintained Outcome: Progressing   Problem: Pain Managment: Goal: General experience of comfort will improve Outcome: Progressing   Problem: Safety: Goal: Ability to remain free from injury will improve Outcome: Progressing   Problem: Skin Integrity: Goal: Risk for impaired skin integrity will decrease Outcome: Progressing

## 2019-02-27 NOTE — TOC Initial Note (Addendum)
Transition of Care [facility]) - Initial/Assessment Note    Patient Details  Name: Vanessa Keller MRN: [id number] Date of Birth: January 15, 1916  [facility] of [facility]) CM/SW Contact:    Lia Hopping, [place] Phone Number: 02/27/2019, 2:05 PM  Clinical Narrative:                 CSW reached out to the patient Tift, [phone]. She is hopeful the patient will get back to [facility] soon. She express the patient wishes is not to be in the hospital. CSW explain the facility will accept the patient in the am once a quarantine bed is available.  COVID-19 test has been ordered.  Hospice is actively following.  TOC staff will continue to assist with the patient discharge planning.  Expected Discharge Plan: Long Term Nursing Home Barriers to Discharge: Other (comment)(Isolation bed not readily available 11/18, bed will be ready 11/19. COVID-19 test pending.)   Patient Goals and CMS Choice        Expected Discharge Plan and Services Expected Discharge Plan: [place] In-house Referral: Clinical Social Work, Hospice / [place] Acute Care Choice: Hospice Living arrangements for the past 2 months: [place]                                      Prior Living Arrangements/Services Living arrangements for the past 2 months: [place] Lives with:: Facility Resident Patient language and need for interpreter reviewed:: No Do you feel safe going back to the place where you live?: Yes      Need for Family Participation in Patient Care: Yes (Comment) Care giver support system in place?: Yes (comment) Current home services: DME Criminal Activity/Legal Involvement Pertinent to Current Situation/Hospitalization: No - Comment as needed  Activities of Daily Living Home Assistive Devices/Equipment: Blood pressure cuff, Grab bars around toilet, Grab bars in shower, Hand-held shower hose, Hospital bed, Nebulizer, Oxygen,  Wheelchair, Scales, Eyeglasses([facility] has necessary equipment for their residents) ADL Screening (condition at time of admission) Patient's cognitive ability adequate to safely complete daily activities?: No Is the patient deaf or have difficulty hearing?: Yes(hoh) Does the patient have difficulty seeing, even when wearing glasses/contacts?: No Does the patient have difficulty concentrating, remembering, or making decisions?: Yes Patient able to express need for assistance with ADLs?: No Does the patient have difficulty dressing or bathing?: Yes Independently performs ADLs?: No Communication: Independent Dressing (OT): Dependent Is this a change from baseline?: Pre-admission baseline Grooming: Dependent Is this a change from baseline?: Pre-admission baseline Feeding: Dependent Is this a change from baseline?: Pre-admission baseline Bathing: Dependent Is this a change from baseline?: Pre-admission baseline Toileting: Dependent Is this a change from baseline?: Pre-admission baseline In/Out Bed: Dependent Is this a change from baseline?: Pre-admission baseline Walks in Home: Dependent(patient is wheelchair bound) Is this a change from baseline?: Pre-admission baseline Does the patient have difficulty walking or climbing stairs?: Yes(secondary to weakness) Weakness of Legs: Both Weakness of Arms/Hands: Both  Permission Sought/Granted Permission sought to share information with : Case Manager, Customer service manager, Family Supports    Share Information with NAME: Coralee Rud HCPOA  Permission granted to share info w AGENCY: [place] granted to share info w Relationship: POA  Permission granted to share info w Contact Information: [phone]  Emotional Assessment Appearance:: Appears younger than stated age Attitude/Demeanor/Rapport: Unable to Assess  Affect (typically observed): Unable to Assess   Alcohol / Substance Use: Not Applicable Psych  Involvement: No (comment)  Admission diagnosis:  Acute cystitis without hematuria [N30.00] Injury of head, initial encounter [S09.90XA] Fall, initial encounter [W19.XXXA] Laceration of nose, initial encounter [id number] Patient Active Problem List   Diagnosis Date Noted  . Fall 02/26/2019  . Dysuria 06/26/2013  . Hospital discharge follow-up 06/26/2013  . Perineal rash 06/26/2013  . Senile dementia, uncomplicated ([place]) [id number]  . Spontaneous pneumothorax 04/27/2013  . Second degree AV block 04/18/2013  . Runny nose 04/18/2013  . Feeling unwell 04/18/2013  . Bradycardia 38 04/18/2013  . AV block, 2nd degree presumed Mobitz with 2:1 conduction 04/18/2013  . Hypertension 04/18/2013  . Hypokalemia 04/18/2013  . Memory problem 02/16/2012  . Hypothyroidism 11/29/2011  . Hearing loss   . Medicare annual wellness visit, initial 08/16/2010  . VITAMIN D DEFICIENCY 01/27/2009  . SECONDARY HYPERPARATHYROIDISM 01/27/2009  . VITAMIN B12 DEFICIENCY 11/05/2008  . SLEEPINESS 10/30/2008  . VERTIGO 11/14/2007  . EAR DISORDER 06/13/2007  . GENERALIZED OSTEOARTHROSIS INVOLVING HAND 04/24/2007  . URINARY URGENCY 04/24/2007  . HYPOTHYROIDISM 10/20/2006  . OSTEOPOROSIS 10/20/2006   PCP:  Dion Body, NP Pharmacy:   [facility] [address], [place] [address] Phone: [phone] Fax: [phone]     Social Determinants of Health (SDOH) Interventions    Readmission Risk Interventions No flowsheet data found.

## 2019-02-27 NOTE — Progress Notes (Signed)
SLP Cancellation Note  Patient Details Name: Vanessa Keller MRN: [id number] DOB: Jul 27, 1915   Cancelled treatment:       Reason Eval/Treat Not Completed: Other (comment)(pt not alert at this time, will continue efforts,? plan to dc to hospice)   Macario Golds 02/27/2019, 11:04 AM  Luanna Salk, MS [facility] SLP Acute Rehab Services Pager [phone] Office [phone]

## 2019-02-27 NOTE — Progress Notes (Signed)
PROGRESS NOTE    Vanessa Keller  [id number] DOB: Sep 03, 1915 DOA: 02/26/2019 PCP: Dion Body, NP   Brief Narrative: HPI per Dr. Truddie Hidden on 02/26/2019  Vanessa Keller is a 85 y.o. female with medical history significant of Hypothyroidism, Osteoporosis, heart block s/Vanessa ppm, falls, advanced dementia who has been on hospice who presented today after fall at her nursing facility.  Per facility- patient resides at [facility], patient had slid out of her wheelchair and onto the ground, sustained facial trauma and with a lacertion to her nose and the L. Side of her forehead.  They gave her tramadol and she was then transported via EMS.  The patient is currently under bair hugger and is unable to verbalize any complaints.  She appears frail and injuries sustained are notable.  Spoke with Hospice care, they report have that patient has been enrolled in hospice since July 30.  Dr. Laverta Baltimore spoke with Vanessa Keller who is the patient's designated power of attorney and he was unaware of her status of hospice (is a laywer) and patient does not have family listed.  I spoke with hospice agency, they state she has a POA and HPOA and Sharlee Blew [phone] - Home, [phone]- Cell).  They state her husband and son have passed away.  On her form she does not wish to be hospitalized, have tube feeds, is DNR/DNI.  Discussed current findings with the hospice agency and stated given fall and laceration would provide short term care to address this and antibiotics, but given previously stated wishes would try and return her to facility after she has been rewarmed and would give antibiotics while here.  The goal would remain comfort and to avoid any escalation of care.    I spoke with Sharlee Blew, patient's Healthcare POA, and she felt in agreement it was appropriate to monitor her overnight here, allow patient to receive antibiotics and rewarm as planned without escalation of care.  **Interim History  Patient  was not as alert this morning and lab values are stable.  She is currently being treated for a urinary tract infection and will continue IV ceftriaxone again today and tomorrow.  Sensitivities will be resulted tomorrow.  Patient was to be discharged back to skilled nursing facility with hospice today however currently they do not have a bed available today and likely will have it ready tomorrow.  In the interim we will repeat her COVID testing per social work discussion with the skilled nursing facility.  Assessment & Plan:   Active Problems:   Fall  Fall -Patient appears to have simply slid out of her wheelchair and in setting of her advanced age and dementia, sustained facial trauma.  She has lacerations but no intracranial abnormalities on CTH,  nor other acute findings on survey C-spine or maxillofacial CT -Continue supportive care and pain control with her acetaminophen, oxycodone 10 mg Vanessa.o. daily as well as hydrocodone-acetaminophen 2 tabs Vanessa.o. every 6 as needed for moderate pain -Will D/C C Collar now that no Fx were seen on CT   Hypothermia Suspected UTI -Patient may have infection contributing to this though obtained labs are largely unremarkable; patient has in her MOST that she would not want hospitalizations.  Dr. [name] discussed with [name], Healthcare power of attorney regarding request for admission and that we would not escalate care but provide supportive care with rewarming and Rx possible UTI if it were contributing but would otherwise focus on comfort measures. -C/w IV Ceftriaxone for Now  and de-escalate as accordingly and appropriately based on sensitivities -LA went from 2.1 -> 1.6  -Continue supportive care with IV fluid hydration with normal saline at a rate of 50 mL per hour and received a 500 mL bolus yesterday -Urinalysis showed hazy appearance with amber color urine, negative leukocytes, positive nitrites, many bacteria, mucus present as well as 6-10  WBCs -Urine culture showed greater than 100,000 colony-forming units of E. coli with sensitivities pending -Blood cultures x2 showed no growth to date less than 24 hours -Of note SARS-CoV-2 testing was negative and facility is requested to repeat so we will repeat today  Advanced Age and Dementia Hypothyroidism and Secondary Hyperparathyroidism Osteoporosis Heart block s/Vanessa PPM -Ccontinued pta donepezil, levothyroxine 75 mcg and Vit D (as tolerated) -Continue comfort measures and anticipate return to SNF tomorrow.  Hospice agency updated of plan. -SLP was consulted for swallow evaluation but patient was not as alert; currently on a full liquid diet will continue  Normocytic Anemia -Patient hemoglobin/hematocrit went from 11.4/37.0 is now 10.9/34.5 -Likely dilutional drop in the setting of IV fluid hydration -Continue to monitor for signs and symptoms of bleeding; currently no overt bleeding noted -We will not check anemia panel in the a.m -Follow-up with PCP as deemed necessary  Elevated BUN -Patient be on admission was 28 and repeat is -71 -We will continue to monitor and trend and repeat CMP in the outpatient setting -Nursing reports no evidence of any melena or bleeding  DVT prophylaxis: SCDs Code Status: DO NOT RESUSCITATE  Family Communication: No family or power of attorney present  Disposition Plan: SNF in AM   Consultants:   None  Procedures:  None   Antimicrobials:  Anti-infectives (From admission, onward)   Start     Dose/Rate Route Frequency Ordered Stop   02/27/19 1200  cefTRIAXone (ROCEPHIN) 1 g in sodium chloride 0.9 % 100 mL IVPB     1 g 200 mL/hr over 30 Minutes Intravenous Every 24 hours 02/26/19 1825     02/26/19 1215  cefTRIAXone (ROCEPHIN) 1 g in sodium chloride 0.9 % 100 mL IVPB     1 g 200 mL/hr over 30 Minutes Intravenous  Once 02/26/19 1205 02/26/19 1350     Subjective: Seen and examined and she was not as alert or awake and was resting  comfortably when I evaluated her.  She is wearing a c-collar but I asked the nurse to remove it given that she had no fractures.  Patient was to be discharged back to skilled nursing facility today however SNF called and stated that she had no bed available at this time given that she needed to be quarantined and they wanted to repeat Covid test and likely will be discharged home in a.m.  No other concerns or complaints at this time and patient is unable to provide a subjective history to me given her current condition  Objective: Vitals:   02/26/19 1946 02/26/19 2000 02/26/19 2049 02/27/19 0506  BP:  (!) 137/114 (!) 144/83 104/89  Pulse:  (!) 107 74 90  Resp:  (!) 24 18 20   Temp: 99.2 F (37.3 C)  98 F (36.7 C) 98.1 F (36.7 C)  TempSrc:   Oral Oral  SpO2:  98% 91% 100%    Intake/Output Summary (Last 24 hours) at 02/27/2019 1204 Last data filed at 02/27/2019 [id number] Gross per 24 hour  Intake 860 ml  Output 0 ml  Net 860 ml   There were no vitals filed for this visit.  Examination: Physical Exam:  Constitutional: Thin elderly frail Caucasian female in NAD and appears calm  Eyes: Lids and conjunctivae normal, sclerae anicteric  ENMT: Has some facial bruising and lacerations noted Neck: Appears normal, supple, no cervical masses, normal ROM, no appreciable thyromegaly; no appreciable JVD Respiratory: Slightly diminished to auscultation bilaterally, no wheezing, rales, rhonchi or crackles. Normal respiratory effort and patient is not tachypenic. No accessory muscle use.  Cardiovascular: RRR, has a 2 out of 6 systolic murmur. S1 and S2 auscultated. No extremity edema.  Abdomen: Soft, non-tender, non-distended. Bowel sounds positive x4.  GU: Deferred. Musculoskeletal: No clubbing / cyanosis of digits/nails. No joint deformity upper and lower extremities Skin: No rashes, lesions, ulcers on limited skin evaluation but does have some facial bruising and laceration no induration; Warm and  dry.  Neurologic: Not awake or alert and does not follow commands.  Romberg sign and cerebellar reflexes not assessed.  Psychiatric: Impaired judgment and insight.  Not awake or alert and is not oriented x3.  Appears calm  Data Reviewed: I have personally reviewed following labs and imaging studies  CBC: Recent Labs  Lab 02/26/19 1039 02/27/19 0849  WBC 8.6 9.3  NEUTROABS 5.6 5.6  HGB 11.4* 10.9*  HCT 37.0 34.5*  MCV 87.1 86.0  PLT 176 [id number]   Basic Metabolic Panel: Recent Labs  Lab 02/26/19 1039 02/27/19 0849  NA 139 139  K 4.1 3.7  CL 104 107  CO2 28 25  GLUCOSE 140* 105*  BUN 28* 29*  CREATININE 0.44 0.77  CALCIUM 8.9 8.4*  MG  --  2.2  PHOS  --  3.9   GFR: CrCl cannot be calculated (Unknown ideal weight.). Liver Function Tests: Recent Labs  Lab 02/26/19 1039 02/27/19 0849  AST 29 24  ALT 20 18  ALKPHOS 74 70  BILITOT 0.8 0.8  PROT 6.5 5.9*  ALBUMIN 3.5 3.1*   Recent Labs  Lab 02/26/19 1039  LIPASE 17   No results for input(s): AMMONIA in the last 168 hours. Coagulation Profile: No results for input(s): INR, PROTIME in the last 168 hours. Cardiac Enzymes: No results for input(s): CKTOTAL, CKMB, CKMBINDEX, TROPONINI in the last 168 hours. BNP (last 3 results) No results for input(s): PROBNP in the last 8760 hours. HbA1C: No results for input(s): HGBA1C in the last 72 hours. CBG: No results for input(s): GLUCAP in the last 168 hours. Lipid Profile: No results for input(s): CHOL, HDL, LDLCALC, TRIG, CHOLHDL, LDLDIRECT in the last 72 hours. Thyroid Function Tests: No results for input(s): TSH, T4TOTAL, FREET4, T3FREE, THYROIDAB in the last 72 hours. Anemia Panel: No results for input(s): VITAMINB12, FOLATE, FERRITIN, TIBC, IRON, RETICCTPCT in the last 72 hours. Sepsis Labs: Recent Labs  Lab 02/26/19 1039 02/26/19 2056  LATICACIDVEN 2.1* 1.6    Recent Results (from the past 240 hour(s))  Urine culture     Status: Abnormal (Preliminary result)    Collection Time: 02/26/19 10:39 AM   Specimen: Urine, Clean Catch  Result Value Ref Range Status   Specimen Description   Final    URINE, CLEAN CATCH Performed at [facility], [place] [address], [place], [place] [address]    Special Requests   Final    Normal Performed at [facility], [place] [address], [place], [place] [address]    Culture (A)  Final    >=100,000 COLONIES/mL ESCHERICHIA COLI SUSCEPTIBILITIES TO FOLLOW Performed at [facility], [place] [address], [place], [place] [address]  Report Status PENDING  Incomplete  Culture, blood (routine x 2)     Status: None (Preliminary result)   Collection Time: 02/26/19 10:39 AM   Specimen: BLOOD  Result Value Ref Range Status   Specimen Description   Final    BLOOD BLOOD RIGHT FOREARM Performed at [place] [facility] [place] [address], [place], [place] [address]    Special Requests   Final    BOTTLES DRAWN AEROBIC AND ANAEROBIC Blood Culture results may not be optimal due to an inadequate volume of blood received in culture bottles Performed at [facility] [address], [place], [place] [address]    Culture   Final    NO GROWTH < 24 HOURS Performed at [place] [address] [place], [place] [address]    Report Status PENDING  Incomplete  Culture, blood (routine x 2)     Status: None (Preliminary result)   Collection Time: 02/26/19 10:44 AM   Specimen: BLOOD  Result Value Ref Range Status   Specimen Description   Final    BLOOD RIGHT ANTECUBITAL Performed at [facility] [address] [place], [place] [address]    Special Requests   Final    BOTTLES DRAWN AEROBIC AND ANAEROBIC Blood Culture results may not be optimal due to an excessive volume of blood received in culture bottles Performed at [facility] [address] [place], [place] [address]    Culture   Final    NO GROWTH < 24  HOURS Performed at [facility] [address], [place], [place] [address]    Report Status PENDING  Incomplete  SARS CORONAVIRUS 2 (TAT 6-24 HRS) Nasopharyngeal Nasopharyngeal Swab     Status: None   Collection Time: 02/26/19  7:19 PM   Specimen: Nasopharyngeal Swab  Result Value Ref Range Status   SARS Coronavirus 2 NEGATIVE NEGATIVE Final    Comment: (NOTE) SARS-CoV-2 target nucleic acids are NOT DETECTED. The SARS-CoV-2 RNA is generally detectable in upper and lower respiratory specimens during the acute phase of infection. Negative results do not preclude SARS-CoV-2 infection, do not rule out co-infections with other pathogens, and should not be used as the sole basis for treatment or other patient management decisions. Negative results must be combined with clinical observations, patient history, and epidemiological information. The expected result is Negative. Fact Sheet for Patients: SugarRoll.be Fact Sheet for Healthcare Providers: https://www.woods-mathews.com/ This test is not yet approved or cleared by the [place] FDA and  has been authorized for detection and/or diagnosis of SARS-CoV-2 by FDA under an Emergency Use Authorization (EUA). This EUA will remain  in effect (meaning this test can be used) for the duration of the COVID-19 declaration under Section 56 4(b)(1) of the Act, 21 U.S.C. section 360bbb-3(b)(1), unless the authorization is terminated or revoked sooner. Performed at [facility], [place] [address] [place],  [address]     Radiology Studies: Ct Head Wo Contrast  Result Date: 02/26/2019 CLINICAL DATA:  Slid out of wheelchair onto ground.  Trauma. EXAM: CT HEAD WITHOUT CONTRAST CT MAXILLOFACIAL WITHOUT CONTRAST CT CERVICAL SPINE WITHOUT CONTRAST TECHNIQUE: Multidetector CT imaging of the head, cervical spine, and maxillofacial structures were performed using the standard protocol without  intravenous contrast. Multiplanar CT image reconstructions of the cervical spine and maxillofacial structures were also generated. COMPARISON:  None. FINDINGS: CT HEAD FINDINGS Brain: No evidence of acute infarction, hemorrhage, hydrocephalus, extra-axial collection or mass lesion/mass effect.  There is mild diffuse low-attenuation within the subcortical and periventricular white matter compatible with chronic microvascular disease. Prominence of the sulci and ventricles compatible with age related brain atrophy. Vascular: No hyperdense vessel or unexpected calcification. Skull: Normal. Negative for fracture or focal lesion. Other: None. CT MAXILLOFACIAL FINDINGS Osseous: No fracture or mandibular dislocation. No destructive process. Orbits: Negative. No traumatic or inflammatory finding. Sinuses: The paranasal sinuses and the mastoid air cells are well aerated. Soft tissues: Soft tissue laceration is identified along the left side of nodes. CT CERVICAL SPINE FINDINGS Alignment: Normal. Skull base and vertebrae: No acute fracture. No primary bone lesion or focal pathologic process. Soft tissues and spinal canal: No prevertebral fluid or swelling. No visible canal hematoma. Disc levels: Multi level facet hypertrophy and degenerative changes noted throughout the cervical spine, mild multilevel disc space narrowing and endplate spurring noted. This is most advanced at C5-6. Upper chest: Negative. Other: None IMPRESSION: 1. No acute intracranial abnormalities. 2. Chronic small vessel ischemic disease and brain atrophy. 3. No facial bone fracture. Laceration is noted along the left side of nodes. 4. No cervical spine fracture or dislocation. 5. Cervical degenerative disc disease. Electronically Signed   By: Kerby Moors M.D.   On: 02/26/2019 12:02   Ct Cervical Spine Wo Contrast  Result Date: 02/26/2019 CLINICAL DATA:  Slid out of wheelchair onto ground.  Trauma. EXAM: CT HEAD WITHOUT CONTRAST CT MAXILLOFACIAL  WITHOUT CONTRAST CT CERVICAL SPINE WITHOUT CONTRAST TECHNIQUE: Multidetector CT imaging of the head, cervical spine, and maxillofacial structures were performed using the standard protocol without intravenous contrast. Multiplanar CT image reconstructions of the cervical spine and maxillofacial structures were also generated. COMPARISON:  None. FINDINGS: CT HEAD FINDINGS Brain: No evidence of acute infarction, hemorrhage, hydrocephalus, extra-axial collection or mass lesion/mass effect. There is mild diffuse low-attenuation within the subcortical and periventricular white matter compatible with chronic microvascular disease. Prominence of the sulci and ventricles compatible with age related brain atrophy. Vascular: No hyperdense vessel or unexpected calcification. Skull: Normal. Negative for fracture or focal lesion. Other: None. CT MAXILLOFACIAL FINDINGS Osseous: No fracture or mandibular dislocation. No destructive process. Orbits: Negative. No traumatic or inflammatory finding. Sinuses: The paranasal sinuses and the mastoid air cells are well aerated. Soft tissues: Soft tissue laceration is identified along the left side of nodes. CT CERVICAL SPINE FINDINGS Alignment: Normal. Skull base and vertebrae: No acute fracture. No primary bone lesion or focal pathologic process. Soft tissues and spinal canal: No prevertebral fluid or swelling. No visible canal hematoma. Disc levels: Multi level facet hypertrophy and degenerative changes noted throughout the cervical spine, mild multilevel disc space narrowing and endplate spurring noted. This is most advanced at C5-6. Upper chest: Negative. Other: None IMPRESSION: 1. No acute intracranial abnormalities. 2. Chronic small vessel ischemic disease and brain atrophy. 3. No facial bone fracture. Laceration is noted along the left side of nodes. 4. No cervical spine fracture or dislocation. 5. Cervical degenerative disc disease. Electronically Signed   By: Kerby Moors M.D.    On: 02/26/2019 12:02   Dg Chest Portable 1 View  Result Date: 02/26/2019 CLINICAL DATA:  Hypothermia EXAM: PORTABLE CHEST 1 VIEW COMPARISON:  April 22, 2013 FINDINGS: There is mild atelectasis in the left base. There is no edema or consolidation. Heart is upper normal in size with pulmonary vascularity normal. Pacemaker leads are attached to the right atrium and right ventricle. There is aortic atherosclerosis. Bones are osteoporotic. IMPRESSION: Mild left base atelectasis. No edema or consolidation. Stable  cardiac silhouette. Pacemaker leads attached to right atrium and right ventricle. Bones osteoporotic. Aortic Atherosclerosis (ICD10-I70.0). Electronically Signed   By: Lowella Grip III M.D.   On: 02/26/2019 11:52   Ct Maxillofacial Wo Contrast  Result Date: 02/26/2019 CLINICAL DATA:  Slid out of wheelchair onto ground.  Trauma. EXAM: CT HEAD WITHOUT CONTRAST CT MAXILLOFACIAL WITHOUT CONTRAST CT CERVICAL SPINE WITHOUT CONTRAST TECHNIQUE: Multidetector CT imaging of the head, cervical spine, and maxillofacial structures were performed using the standard protocol without intravenous contrast. Multiplanar CT image reconstructions of the cervical spine and maxillofacial structures were also generated. COMPARISON:  None. FINDINGS: CT HEAD FINDINGS Brain: No evidence of acute infarction, hemorrhage, hydrocephalus, extra-axial collection or mass lesion/mass effect. There is mild diffuse low-attenuation within the subcortical and periventricular white matter compatible with chronic microvascular disease. Prominence of the sulci and ventricles compatible with age related brain atrophy. Vascular: No hyperdense vessel or unexpected calcification. Skull: Normal. Negative for fracture or focal lesion. Other: None. CT MAXILLOFACIAL FINDINGS Osseous: No fracture or mandibular dislocation. No destructive process. Orbits: Negative. No traumatic or inflammatory finding. Sinuses: The paranasal sinuses and the  mastoid air cells are well aerated. Soft tissues: Soft tissue laceration is identified along the left side of nodes. CT CERVICAL SPINE FINDINGS Alignment: Normal. Skull base and vertebrae: No acute fracture. No primary bone lesion or focal pathologic process. Soft tissues and spinal canal: No prevertebral fluid or swelling. No visible canal hematoma. Disc levels: Multi level facet hypertrophy and degenerative changes noted throughout the cervical spine, mild multilevel disc space narrowing and endplate spurring noted. This is most advanced at C5-6. Upper chest: Negative. Other: None IMPRESSION: 1. No acute intracranial abnormalities. 2. Chronic small vessel ischemic disease and brain atrophy. 3. No facial bone fracture. Laceration is noted along the left side of nodes. 4. No cervical spine fracture or dislocation. 5. Cervical degenerative disc disease. Electronically Signed   By: Kerby Moors M.D.   On: 02/26/2019 12:02   Scheduled Meds:  cholecalciferol  2,000 Units Oral Daily   donepezil  5 mg Oral QHS   levothyroxine  75 mcg Oral Q0600   oxyCODONE  10 mg Oral Daily   Continuous Infusions:  sodium chloride 50 mL/hr at 02/27/19 0310   cefTRIAXone (ROCEPHIN)  IV 1 g (02/27/19 1048)    LOS: 0 days   Kerney Elbe, DO [facility] PAGER is on AMION  If 7PM-7AM, please contact night-coverage [email or web address]

## 2019-02-27 NOTE — Progress Notes (Addendum)
[facility] 1335  - Manufacturing engineer (ACC) - GIP RN Note @ 1330    This a related and covered GIP admission of 02/26/19 with an ACC diagnosis of severe protein malnutrition per Dr. Gildardo Cranker, [facility] Physician. Hospice was notified by facility after patient was transported to the ED for evaluation after falling out of wheelchair and sustaining facial laceration on 02/26/19. Patient was admitted for possible UTI and hypothermia. Patient is a DNR.   Reached out to bedside RN, but was unable at this time.will try to contact later this afternoon for patient update.   Spoke with POA who confirmed patient wishes to pursue comfort care with support of Hospice and stated patient was to be discharged back to facility this morning. MD noted in EPIC that Patient was to be discharged back to skilled nursing facility today however SNF called and stated that she had no bed available at this time given that she needed to be quarantined and they wanted to repeat Covid test and likely will be discharged home in a.m.     V/S:  t 98.1, PR 90, BP 104/89, RR 20 on Room air with 100% O2 sats I&O: + 610 Abnormal lab work: hemo 10.9, HCT 34.5, BUN 29, Cal 8.4, Glucose 105, SARS Covid 19 Negative  Diagnostics:  Chest x-ray on 11/17 -  mild base atelectasis, CT head on 11/17 - No acute intracranial abnormalities/fractures, CT spine on 11/17  - No cervical spine fracture or dislocation. IVs/PRNs: Rocephin 1 g in NS 140ml IVPB q24hr, NS IVF infusing at 11mls/hr, Norco/Vicodin 5-325mg  tab adm 2 tabs at 2244 on 11/17   MD Problem list: FALL - pt has lacerations but no intracranial abnormalities on CTH,  nor other acute findings on survey C-spine or maxillofacial CT-Continue supportive care and pain control with her acetaminophen, oxycodone 10 mg p.o. daily as well as hydrocodone-acetaminophen 2 tabs p.o. every 6 as needed for moderate pain-Will D/C C Collar now that no Fx were seen on CT  Hypothermia Suspected UTI -Patient may  have infection contributing to this though obtained labs are largely unremarkable; patient has in her MOST that she would not want hospitalizations.  Dr. [name] discussed with [name], Healthcare power of attorney regarding request for admission and that we would not escalate care but provide supportive care with rewarming and Rx possible UTI if it were contributing but would otherwise focus on comfort measures. -C/w IV Ceftriaxone for Now and de-escalate as accordingly and appropriately based on sensitivities -LA went from 2.1 -> 1.6  -Continue supportive care with IV fluid hydration with normal saline at a rate of 50 mL per hour and received a 500 mL bolus yesterday -Urinalysis showed hazy appearance with amber color urine, negative leukocytes, positive nitrites, many bacteria, mucus present as well as 6-10 WBCs -Urine culture showed greater than 100,000 colony-forming units of E. coli with sensitivities pending -Blood cultures x2 showed no growth to date less than 24 hours -Of note SARS-CoV-2 testing was negative and facility is requested to repeat so we will repeat today    Advanced Age and Dementia Hypothyroidism and Secondary Hyperparathyroidism Osteoporosis Heart block s/p PPM -Ccontinued pta donepezil, levothyroxine 75 mcg and Vit D (as tolerated) -Continue comfort measures and anticipate return to SNF tomorrow.  Hospice agency updated of plan. -SLP was consulted for swallow evaluation but patient was not as alert; currently on a full liquid diet will continue   Normocytic Anemia -Patient hemoglobin/hematocrit went from 11.4/37.0 is now 10.9/34.5 -Likely dilutional  drop in the setting of IV fluid hydration -Continue to monitor for signs and symptoms of bleeding; currently no overt bleeding noted -We will not check anemia panel in the a.m -Follow-up with PCP as deemed necessary   Elevated BUN -Patient be on admission was 28 and repeat is -37 -We will continue to monitor and  trend and repeat CMP in the outpatient setting -Nursing reports no evidence of any melena or bleeding     D/C planning: Plan is to discharge tomorrow back to facility with support of Hospice.   Please call [facility] for [facility] patient transportation needs.  Goals of Care: clear - Patient is a DNR with support of Hospice  Communication with IDT-  [place] team updated Communication with [facility] - Spoke with POA Willena who confirmed patient wishes were not to be in hospital for any longer than needed and patient wishes are to pursue comfort care.  Encouraged POA to call for any hospice concerns/questions and made her aware that [facility] will continue to follow her daily during hospitalization.   Please call with any hospice related questions/concerns,   Gar Ponto, RN  RN PRN/Hospital Liaison  [phone], [phone]   UPDATE:  Transfer Summary and Medication List faxed to [facility] and will be placed on patient Shadow Chart.

## 2019-02-28 ENCOUNTER — Other Ambulatory Visit: Payer: Self-pay

## 2019-02-28 DIAGNOSIS — D649 Anemia, unspecified: Secondary | ICD-10-CM

## 2019-02-28 LAB — URINE CULTURE
Culture: >=100000 — AB
Special Requests: NORMAL

## 2019-02-28 MED ORDER — CEPHALEXIN 250 MG PO CAPS: 250.0000 mg | ORAL_CAPSULE | Freq: Two times a day (BID) | ORAL | Status: DC

## 2019-02-28 MED ORDER — LORAZEPAM 0.5 MG PO TABS: 0.5000 mg | ORAL_TABLET | ORAL | 0 refills | Status: AC | PRN

## 2019-02-28 MED ORDER — TRAMADOL HCL 50 MG PO TABS: 25.0000 mg | ORAL_TABLET | ORAL | 0 refills | Status: AC

## 2019-02-28 MED ORDER — CEPHALEXIN 250 MG PO CAPS: 250.0000 mg | ORAL_CAPSULE | Freq: Two times a day (BID) | ORAL | 0 refills | Status: AC

## 2019-02-28 MED ORDER — MORPHINE SULFATE (CONCENTRATE) 10 MG /0.5 ML PO SOLN: 5.0000 mg | ORAL | 0 refills | Status: AC | PRN

## 2019-02-28 NOTE — Plan of Care (Signed)

## 2019-02-28 NOTE — Discharge Summary (Signed)
Physician Discharge Summary  HANNAN MEGILL [id number] DOB: Apr 17, 1915 DOA: 02/26/2019  PCP: Dion Body, NP  Admit date: 02/26/2019 Discharge date: 02/28/2019  Admitted From: SNF Disposition: SNF with Hospice Care  Recommendations for Outpatient Follow-up:  1. Follow up care per Hospice Protocol  Home Health: No  Equipment/Devices: None  Discharge Condition: Stable  CODE STATUS: DO NOT RESUSCITATE Diet recommendation: Soft Diet   Brief/Interim Summary: HPI per Dr. Truddie Hidden on 02/26/2019  P Tewis a 85 y.o.femalewith medical history significant ofHypothyroidism, Osteoporosis, heart block s/p ppm, falls, advanced dementia who has been on hospice who presented today after fall at her nursing facility.  Per facility- patient resides at [facility], patient had slid out of her wheelchair and onto the ground, sustained facial trauma and with a lacertion to her nose and the L. Side of her forehead. They gave her tramadol and she was then transported via EMS. The patient is currently under bair hugger and is unable to verbalize any complaints. She appears frail and injuries sustained are notable.  Spoke with Hospice care, they report have that patient has been enrolled in hospice since July 30. Dr. Laverta Baltimore spoke with [name] who is the patient's designated power of attorney and he was unaware of her status of hospice (is a laywer) and patient does not have family listed. I spoke with hospice agency, they state she has a POA and HPOA and Sharlee Blew [phone] - Home, [phone]- Cell). They state her husband and son have passed away. On her form she does not wish to be hospitalized, have tube feeds, is DNR/DNI. Discussed current findings with the hospice agency and stated given fall and laceration would provide short term care to address this and antibiotics, but given previously stated wishes would try and return her to facility after she has been rewarmed and would  give antibiotics while here. The goal would remain comfort and to avoid any escalation of care.   I spoke with Sharlee Blew, patient's Healthcare POA, and she felt in agreement it was appropriate to monitor her overnight here, allow patient to receive antibiotics and rewarm as planned without escalation of care.  **Interim History  Patient was not as alert this morning and lab values are stable.  She is currently being treated for a urinary tract infection and will continue IV ceftriaxone again today and tomorrow.  Sensitivities will be resulted tomorrow.  Patient was to be discharged back to skilled nursing facility with hospice today however currently they do not have a bed available today and likely will have it ready tomorrow.  In the interim we will repeat her COVID testing per social work discussion with the skilled nursing facility.  Discharge Diagnoses:  Active Problems:   Fall  Fall -Patient appears to have simply slid out of her wheelchair and in setting of her advanced age and dementia, sustained facial trauma. She has lacerations but no intracranial abnormalities on CTH, nor other acute findings on survey C-spine or maxillofacial CT -Has significant facial bruising  -Continue supportive care and pain control with her acetaminophen, oxycodone 10 mg p.o. daily as well as hydrocodone-acetaminophen 2 tabs p.o. every 6 as needed for moderate pain while hospitalized -Resume home PTA Tramadol and Morphine Sulfate  -Will D/C C Collar now that no Fx were seen on CT   Hypothermia E Coli UTI -Patient may have infection contributing to this though obtained labs are largely unremarkable; patient has in her MOST that she would not want hospitalizations. Dr.  [name] discussed with [name], Healthcare power of attorney regarding request for admission and that we would not escalate care but provide supportive care with rewarming and Rx possible UTI if it were contributing but would  otherwise focus on comfort measures. -C/w IV Ceftriaxone for Now and de-escalate as accordingly and appropriately based on sensitivities -LA went from 2.1 -> 1.6  -Continued supportive care with IV fluid hydration with normal saline at a rate of 50 mL per hour and received a 500 mL bolus the day before yesterday -Urinalysis showed hazy appearance with amber color urine, negative leukocytes, positive nitrites, many bacteria, mucus present as well as 6-10 WBCs -Urine culture showed greater than 100,000 colony-forming units of E. Coli that was pan-sensitive; Will give last dose of IV Ceftriaxone and transition to po Keflex 250 mg q12h for 2 more days to complete 5 day course  -Blood cultures x2 showed no growth to date less than 24 hours -Of note SARS-CoV-2 testing was negative and facility is requested to repeat so we will repeat today  Advanced Age and Dementia Hypothyroidism and Secondary Hyperparathyroidism Osteoporosis Heart block s/p PPM -Continued Donepezil, levothyroxine 75 mcg and Vit D (as tolerated) but upon further review does not take Donepezil anymore so it was discontinued  -Continue comfort measures and anticipate return to SNF today. Hospice agency updated of plan. -SLP was consulted for swallow evaluation but patient was not as alert; currently on a full liquid diet will continue for now and place on a Soft Diet  Normocytic Anemia -Patient hemoglobin/hematocrit went from 11.4/37.0 is now 10.9/34.5 yesterday  -Likely dilutional drop in the setting of IV fluid hydration -Continue to monitor for signs and symptoms of bleeding; currently no overt bleeding noted -We will not check anemia panel in the a.m -Follow-up with PCP as deemed necessary  Elevated BUN -Patient be on admission was 28 and repeat is -56 -We will continue to monitor and trend and repeat CMP in the outpatient setting -Nursing reports no evidence of any melena or bleeding  Discharge  Instructions  Discharge Instructions    Call MD for:  difficulty breathing, headache or visual disturbances   Complete by: As directed    Call MD for:  extreme fatigue   Complete by: As directed    Call MD for:  hives   Complete by: As directed    Call MD for:  persistant dizziness or light-headedness   Complete by: As directed    Call MD for:  persistant nausea and vomiting   Complete by: As directed    Call MD for:  redness, tenderness, or signs of infection (pain, swelling, redness, odor or green/yellow discharge around incision site)   Complete by: As directed    Call MD for:  severe uncontrolled pain   Complete by: As directed    Call MD for:  temperature >100.4   Complete by: As directed    Diet - low sodium heart healthy   Complete by: As directed    SOFT   Discharge instructions   Complete by: As directed    Further Care per Hospice Protocol   Increase activity slowly   Complete by: As directed      Allergies as of 02/28/2019   No Known Allergies     Medication List    TAKE these medications   cephALEXin 250 MG capsule Commonly known as: KEFLEX Take 1 capsule (250 mg total) by mouth every 12 (twelve) hours for 4 doses. Start taking on: March 01, 2019   Cholecalciferol 100 MCG (4000 UT) Caps Take 4,000 Units by mouth daily.   Ensure Take 237 mLs by mouth 3 (three) times daily with meals.   levothyroxine 75 MCG tablet Commonly known as: SYNTHROID Take 1 tablet (75 mcg total) by mouth daily before breakfast.   LORazepam 0.5 MG tablet Commonly known as: ATIVAN Take 1 tablet (0.5 mg total) by mouth every 4 (four) hours as needed for anxiety (agitation).   morphine CONCENTRATE 10 mg / 0.5 ml concentrated solution Take 0.25 mLs (5 mg total) by mouth every 2 (two) hours as needed for severe pain.   polyethylene glycol 17 g packet Commonly known as: MIRALAX / GLYCOLAX Take 17 g by mouth daily.   QUEtiapine 25 MG tablet Commonly known as: SEROQUEL Take  25 mg by mouth at bedtime.   sennosides 8.8 MG/5ML syrup Commonly known as: SENOKOT Take 8.8 mg by mouth at bedtime.   traMADol 50 MG tablet Commonly known as: ULTRAM Take 0.5 tablets (25 mg total) by mouth See admin instructions. Takek 25mg  every morning at 6:30am. Take 25mg  every 6 hours as needed for pain.      Follow-up [place], NP. Call.   Specialty: Nurse Practitioner Why: Follow up within 1 week Contact information: [place] [id number] [phone]          No Known Allergies  Consultations:  Hospice  Procedures/Studies: Ct Head Wo Contrast  Result Date: 02/26/2019 CLINICAL DATA:  Slid out of wheelchair onto ground.  Trauma. EXAM: CT HEAD WITHOUT CONTRAST CT MAXILLOFACIAL WITHOUT CONTRAST CT CERVICAL SPINE WITHOUT CONTRAST TECHNIQUE: Multidetector CT imaging of the head, cervical spine, and maxillofacial structures were performed using the standard protocol without intravenous contrast. Multiplanar CT image reconstructions of the cervical spine and maxillofacial structures were also generated. COMPARISON:  None. FINDINGS: CT HEAD FINDINGS Brain: No evidence of acute infarction, hemorrhage, hydrocephalus, extra-axial collection or mass lesion/mass effect. There is mild diffuse low-attenuation within the subcortical and periventricular white matter compatible with chronic microvascular disease. Prominence of the sulci and ventricles compatible with age related brain atrophy. Vascular: No hyperdense vessel or unexpected calcification. Skull: Normal. Negative for fracture or focal lesion. Other: None. CT MAXILLOFACIAL FINDINGS Osseous: No fracture or mandibular dislocation. No destructive process. Orbits: Negative. No traumatic or inflammatory finding. Sinuses: The paranasal sinuses and the mastoid air cells are well aerated. Soft tissues: Soft tissue laceration is identified along the left side of nodes. CT CERVICAL SPINE FINDINGS  Alignment: Normal. Skull base and vertebrae: No acute fracture. No primary bone lesion or focal pathologic process. Soft tissues and spinal canal: No prevertebral fluid or swelling. No visible canal hematoma. Disc levels: Multi level facet hypertrophy and degenerative changes noted throughout the cervical spine, mild multilevel disc space narrowing and endplate spurring noted. This is most advanced at C5-6. Upper chest: Negative. Other: None IMPRESSION: 1. No acute intracranial abnormalities. 2. Chronic small vessel ischemic disease and brain atrophy. 3. No facial bone fracture. Laceration is noted along the left side of nodes. 4. No cervical spine fracture or dislocation. 5. Cervical degenerative disc disease. Electronically Signed   By: Kerby Moors M.D.   On: 02/26/2019 12:02   Ct Cervical Spine Wo Contrast  Result Date: 02/26/2019 CLINICAL DATA:  Slid out of wheelchair onto ground.  Trauma. EXAM: CT HEAD WITHOUT CONTRAST CT MAXILLOFACIAL WITHOUT CONTRAST CT CERVICAL SPINE WITHOUT CONTRAST TECHNIQUE: Multidetector CT imaging of the head, cervical spine, and  maxillofacial structures were performed using the standard protocol without intravenous contrast. Multiplanar CT image reconstructions of the cervical spine and maxillofacial structures were also generated. COMPARISON:  None. FINDINGS: CT HEAD FINDINGS Brain: No evidence of acute infarction, hemorrhage, hydrocephalus, extra-axial collection or mass lesion/mass effect. There is mild diffuse low-attenuation within the subcortical and periventricular white matter compatible with chronic microvascular disease. Prominence of the sulci and ventricles compatible with age related brain atrophy. Vascular: No hyperdense vessel or unexpected calcification. Skull: Normal. Negative for fracture or focal lesion. Other: None. CT MAXILLOFACIAL FINDINGS Osseous: No fracture or mandibular dislocation. No destructive process. Orbits: Negative. No traumatic or inflammatory  finding. Sinuses: The paranasal sinuses and the mastoid air cells are well aerated. Soft tissues: Soft tissue laceration is identified along the left side of nodes. CT CERVICAL SPINE FINDINGS Alignment: Normal. Skull base and vertebrae: No acute fracture. No primary bone lesion or focal pathologic process. Soft tissues and spinal canal: No prevertebral fluid or swelling. No visible canal hematoma. Disc levels: Multi level facet hypertrophy and degenerative changes noted throughout the cervical spine, mild multilevel disc space narrowing and endplate spurring noted. This is most advanced at C5-6. Upper chest: Negative. Other: None IMPRESSION: 1. No acute intracranial abnormalities. 2. Chronic small vessel ischemic disease and brain atrophy. 3. No facial bone fracture. Laceration is noted along the left side of nodes. 4. No cervical spine fracture or dislocation. 5. Cervical degenerative disc disease. Electronically Signed   By: Kerby Moors M.D.   On: 02/26/2019 12:02   Dg Chest Portable 1 View  Result Date: 02/26/2019 CLINICAL DATA:  Hypothermia EXAM: PORTABLE CHEST 1 VIEW COMPARISON:  April 22, 2013 FINDINGS: There is mild atelectasis in the left base. There is no edema or consolidation. Heart is upper normal in size with pulmonary vascularity normal. Pacemaker leads are attached to the right atrium and right ventricle. There is aortic atherosclerosis. Bones are osteoporotic. IMPRESSION: Mild left base atelectasis. No edema or consolidation. Stable cardiac silhouette. Pacemaker leads attached to right atrium and right ventricle. Bones osteoporotic. Aortic Atherosclerosis (ICD10-I70.0). Electronically Signed   By: Lowella Grip III M.D.   On: 02/26/2019 11:52   Ct Maxillofacial Wo Contrast  Result Date: 02/26/2019 CLINICAL DATA:  Slid out of wheelchair onto ground.  Trauma. EXAM: CT HEAD WITHOUT CONTRAST CT MAXILLOFACIAL WITHOUT CONTRAST CT CERVICAL SPINE WITHOUT CONTRAST TECHNIQUE: Multidetector  CT imaging of the head, cervical spine, and maxillofacial structures were performed using the standard protocol without intravenous contrast. Multiplanar CT image reconstructions of the cervical spine and maxillofacial structures were also generated. COMPARISON:  None. FINDINGS: CT HEAD FINDINGS Brain: No evidence of acute infarction, hemorrhage, hydrocephalus, extra-axial collection or mass lesion/mass effect. There is mild diffuse low-attenuation within the subcortical and periventricular white matter compatible with chronic microvascular disease. Prominence of the sulci and ventricles compatible with age related brain atrophy. Vascular: No hyperdense vessel or unexpected calcification. Skull: Normal. Negative for fracture or focal lesion. Other: None. CT MAXILLOFACIAL FINDINGS Osseous: No fracture or mandibular dislocation. No destructive process. Orbits: Negative. No traumatic or inflammatory finding. Sinuses: The paranasal sinuses and the mastoid air cells are well aerated. Soft tissues: Soft tissue laceration is identified along the left side of nodes. CT CERVICAL SPINE FINDINGS Alignment: Normal. Skull base and vertebrae: No acute fracture. No primary bone lesion or focal pathologic process. Soft tissues and spinal canal: No prevertebral fluid or swelling. No visible canal hematoma. Disc levels: Multi level facet hypertrophy and degenerative changes noted throughout the cervical  spine, mild multilevel disc space narrowing and endplate spurring noted. This is most advanced at C5-6. Upper chest: Negative. Other: None IMPRESSION: 1. No acute intracranial abnormalities. 2. Chronic small vessel ischemic disease and brain atrophy. 3. No facial bone fracture. Laceration is noted along the left side of nodes. 4. No cervical spine fracture or dislocation. 5. Cervical degenerative disc disease. Electronically Signed   By: Kerby Moors M.D.   On: 02/26/2019 12:02    Subjective: Seen and examined at bedside she is  more awake today but pleasantly demented and unable to provide a subjective history.  Had no specific complaints.  Patient still significantly bruised especially on the eyes and had some mild facial laceration.  No other concerns or complaints at this time is ready to be discharged back to skilled nursing facility with hospice  Discharge Exam: Vitals:   02/27/19 1313 02/28/19 0506  BP: (!) 92/48 (!) 109/59  Pulse: 67 87  Resp: 15 16  Temp: 97.7 F (36.5 C)   SpO2: 96% 98%   Vitals:   02/27/19 0506 02/27/19 1313 02/27/19 1540 02/28/19 0506  BP: 104/89 (!) 92/48  (!) 109/59  Pulse: 90 67  87  Resp: 20 15  16   Temp: 98.1 F (36.7 C) 97.7 F (36.5 C)    TempSrc: Oral     SpO2: 100% 96%  98%  Weight:   46.3 kg   Height:   5\' 7"  (1.702 m)    General: Pt is awake, not in acute distress Cardiovascular: RRR, S1/S2 +, no rubs, no gallops Respiratory: Diminished bilaterally, no wheezing, no rhonchi; unlabored breathing Abdominal: Soft, NT, ND, bowel sounds + Extremities: no edema, no cyanosis; has some bruising and ecchymosis noted  The results of significant diagnostics from this hospitalization (including imaging, microbiology, ancillary and laboratory) are listed below for reference.    Microbiology: Recent Results (from the past 240 hour(s))  Urine culture     Status: Abnormal   Collection Time: 02/26/19 10:39 AM   Specimen: Urine, Clean Catch  Result Value Ref Range Status   Specimen Description   Final    URINE, CLEAN CATCH Performed at [facility], [place] [address], [place], [place] [address]    Special Requests   Final    Normal Performed at [facility], [place] [address] [place],  [address]    Culture >=100,000 COLONIES/mL ESCHERICHIA COLI (A)  Final   Report Status 02/28/2019 FINAL  Final   Organism ID, Bacteria ESCHERICHIA COLI (A)  Final      Susceptibility   Escherichia coli - MIC*    AMPICILLIN <=2 SENSITIVE Sensitive      CEFAZOLIN <=4 SENSITIVE Sensitive     CEFTRIAXONE <=1 SENSITIVE Sensitive     CIPROFLOXACIN <=0.25 SENSITIVE Sensitive     GENTAMICIN <=1 SENSITIVE Sensitive     IMIPENEM <=0.25 SENSITIVE Sensitive     NITROFURANTOIN <=16 SENSITIVE Sensitive     TRIMETH/SULFA <=20 SENSITIVE Sensitive     AMPICILLIN/SULBACTAM <=2 SENSITIVE Sensitive     PIP/TAZO <=4 SENSITIVE Sensitive     Extended ESBL NEGATIVE Sensitive     * >=100,000 COLONIES/mL ESCHERICHIA COLI  Culture, blood (routine x 2)     Status: None (Preliminary result)   Collection Time: 02/26/19 10:39 AM   Specimen: BLOOD  Result Value Ref Range Status   Specimen Description   Final    BLOOD BLOOD RIGHT FOREARM Performed at [place] [address] [place], [place]  [address]    Special Requests   Final    BOTTLES DRAWN AEROBIC AND ANAEROBIC Blood Culture results may not be optimal due to an inadequate volume of blood received in culture bottles Performed at [facility], [place] [address] [place], [place] [address]    Culture   Final    NO GROWTH 2 DAYS Performed at [place] [address] [place], [place] [address]    Report Status PENDING  Incomplete  Culture, blood (routine x 2)     Status: None (Preliminary result)   Collection Time: 02/26/19 10:44 AM   Specimen: BLOOD  Result Value Ref Range Status   Specimen Description   Final    BLOOD RIGHT ANTECUBITAL Performed at [place] [address], [place], [place] [address]    Special Requests   Final    BOTTLES DRAWN AEROBIC AND ANAEROBIC Blood Culture results may not be optimal due to an excessive volume of blood received in culture bottles Performed at [place] [address], [place], [place] [address]    Culture   Final    NO GROWTH 2 DAYS Performed at [place] [address], [place], [place] [address]    Report Status PENDING  Incomplete  SARS  CORONAVIRUS 2 (TAT 6-24 HRS) Nasopharyngeal Nasopharyngeal Swab     Status: None   Collection Time: 02/26/19  7:19 PM   Specimen: Nasopharyngeal Swab  Result Value Ref Range Status   SARS Coronavirus 2 NEGATIVE NEGATIVE Final    Comment: (NOTE) SARS-CoV-2 target nucleic acids are NOT DETECTED. The SARS-CoV-2 RNA is generally detectable in upper and lower respiratory specimens during the acute phase of infection. Negative results do not preclude SARS-CoV-2 infection, do not rule out co-infections with other pathogens, and should not be used as the sole basis for treatment or other patient management decisions. Negative results must be combined with clinical observations, patient history, and epidemiological information. The expected result is Negative. Fact Sheet for Patients: SugarRoll.be Fact Sheet for Healthcare Providers: https://www.woods-mathews.com/ This test is not yet approved or cleared by the [place] FDA and  has been authorized for detection and/or diagnosis of SARS-CoV-2 by FDA under an Emergency Use Authorization (EUA). This EUA will remain  in effect (meaning this test can be used) for the duration of the COVID-19 declaration under Section 56 4(b)(1) of the Act, 21 U.S.C. section 360bbb-3(b)(1), unless the authorization is terminated or revoked sooner. Performed at [facility], [place] [address], [place], [place] [address]   SARS CORONAVIRUS 2 (TAT 6-24 HRS) Nasopharyngeal Nasopharyngeal Swab     Status: None   Collection Time: 02/27/19 12:22 PM   Specimen: Nasopharyngeal Swab  Result Value Ref Range Status   SARS Coronavirus 2 NEGATIVE NEGATIVE Final    Comment: (NOTE) SARS-CoV-2 target nucleic acids are NOT DETECTED. The SARS-CoV-2 RNA is generally detectable in upper and lower respiratory specimens during the acute phase of infection. Negative results do not preclude SARS-CoV-2 infection, do not rule  out co-infections with other pathogens, and should not be used as the sole basis for treatment or other patient management decisions. Negative results must be combined with clinical observations, patient history, and epidemiological information. The expected result is Negative. Fact Sheet for Patients: SugarRoll.be Fact Sheet for Healthcare Providers: https://www.woods-mathews.com/ This test is not yet approved or cleared by the [place] FDA and  has been authorized for detection and/or diagnosis of SARS-CoV-2 by FDA  under an Emergency Use Authorization (EUA). This EUA will remain  in effect (meaning this test can be used) for the duration of the COVID-19 declaration under Section 56 4(b)(1) of the Act, 21 U.S.C. section 360bbb-3(b)(1), unless the authorization is terminated or revoked sooner. Performed at [facility], [place] [address] [place], [place] [address]     Labs: BNP (last 3 results) No results for input(s): BNP in the last 8760 hours. Basic Metabolic Panel: Recent Labs  Lab 02/26/19 1039 02/27/19 0849  NA 139 139  K 4.1 3.7  CL 104 107  CO2 28 25  GLUCOSE 140* 105*  BUN 28* 29*  CREATININE 0.44 0.77  CALCIUM 8.9 8.4*  MG  --  2.2  PHOS  --  3.9   Liver Function Tests: Recent Labs  Lab 02/26/19 1039 02/27/19 0849  AST 29 24  ALT 20 18  ALKPHOS 74 70  BILITOT 0.8 0.8  PROT 6.5 5.9*  ALBUMIN 3.5 3.1*   Recent Labs  Lab 02/26/19 1039  LIPASE 17   No results for input(s): AMMONIA in the last 168 hours. CBC: Recent Labs  Lab 02/26/19 1039 02/27/19 0849  WBC 8.6 9.3  NEUTROABS 5.6 5.6  HGB 11.4* 10.9*  HCT 37.0 34.5*  MCV 87.1 86.0  PLT 176 177   Cardiac Enzymes: No results for input(s): CKTOTAL, CKMB, CKMBINDEX, TROPONINI in the last 168 hours. BNP: Invalid input(s): POCBNP CBG: No results for input(s): GLUCAP in the last 168 hours. D-Dimer No results for input(s): DDIMER in the last  72 hours. Hgb A1c No results for input(s): HGBA1C in the last 72 hours. Lipid Profile No results for input(s): CHOL, HDL, LDLCALC, TRIG, CHOLHDL, LDLDIRECT in the last 72 hours. Thyroid function studies No results for input(s): TSH, T4TOTAL, T3FREE, THYROIDAB in the last 72 hours.  Invalid input(s): FREET3 Anemia work up No results for input(s): VITAMINB12, FOLATE, FERRITIN, TIBC, IRON, RETICCTPCT in the last 72 hours. Urinalysis    Component Value Date/Time   COLORURINE AMBER (A) 02/26/2019 1039   APPEARANCEUR HAZY (A) 02/26/2019 1039   LABSPEC 1.019 02/26/2019 1039   PHURINE 5.0 02/26/2019 1039   GLUCOSEU NEGATIVE 02/26/2019 1039   HGBUR NEGATIVE 02/26/2019 1039   BILIRUBINUR NEGATIVE 02/26/2019 1039   BILIRUBINUR Neg 06/26/2013 1522   KETONESUR NEGATIVE 02/26/2019 1039   PROTEINUR NEGATIVE 02/26/2019 1039   UROBILINOGEN 0.2 06/26/2013 1522   NITRITE POSITIVE (A) 02/26/2019 1039   LEUKOCYTESUR NEGATIVE 02/26/2019 1039   Sepsis Labs Invalid input(s): PROCALCITONIN,  WBC,  LACTICIDVEN Microbiology Recent Results (from the past 240 hour(s))  Urine culture     Status: Abnormal   Collection Time: 02/26/19 10:39 AM   Specimen: Urine, Clean Catch  Result Value Ref Range Status   Specimen Description   Final    URINE, CLEAN CATCH Performed at [facility], [place] [address], [place], [place] [address]    Special Requests   Final    Normal Performed at [facility], [place] [address] [place], [place] [address]    Culture >=100,000 COLONIES/mL ESCHERICHIA COLI (A)  Final   Report Status 02/28/2019 FINAL  Final   Organism ID, Bacteria ESCHERICHIA COLI (A)  Final      Susceptibility   Escherichia coli - MIC*    AMPICILLIN <=2 SENSITIVE Sensitive     CEFAZOLIN <=4 SENSITIVE Sensitive     CEFTRIAXONE <=1 SENSITIVE Sensitive     CIPROFLOXACIN <=0.25 SENSITIVE Sensitive     GENTAMICIN <=1  SENSITIVE Sensitive     IMIPENEM <=0.25 SENSITIVE  Sensitive     NITROFURANTOIN <=16 SENSITIVE Sensitive     TRIMETH/SULFA <=20 SENSITIVE Sensitive     AMPICILLIN/SULBACTAM <=2 SENSITIVE Sensitive     PIP/TAZO <=4 SENSITIVE Sensitive     Extended ESBL NEGATIVE Sensitive     * >=100,000 COLONIES/mL ESCHERICHIA COLI  Culture, blood (routine x 2)     Status: None (Preliminary result)   Collection Time: 02/26/19 10:39 AM   Specimen: BLOOD  Result Value Ref Range Status   Specimen Description   Final    BLOOD BLOOD RIGHT FOREARM Performed at [facility] [address], [place], [place] [address]    Special Requests   Final    BOTTLES DRAWN AEROBIC AND ANAEROBIC Blood Culture results may not be optimal due to an inadequate volume of blood received in culture bottles Performed at [facility] [address] [place], [place] [address]    Culture   Final    NO GROWTH 2 DAYS Performed at [facility] [address], [place], [place] [address]    Report Status PENDING  Incomplete  Culture, blood (routine x 2)     Status: None (Preliminary result)   Collection Time: 02/26/19 10:44 AM   Specimen: BLOOD  Result Value Ref Range Status   Specimen Description   Final    BLOOD RIGHT ANTECUBITAL Performed at [facility] [address] [place], [place] [address]    Special Requests   Final    BOTTLES DRAWN AEROBIC AND ANAEROBIC Blood Culture results may not be optimal due to an excessive volume of blood received in culture bottles Performed at [facility] [address], [place], [place] [address]    Culture   Final    NO GROWTH 2 DAYS Performed at [facility] [address] [place],  [address]    Report Status PENDING  Incomplete  SARS CORONAVIRUS 2 (TAT 6-24 HRS) Nasopharyngeal Nasopharyngeal Swab     Status: None   Collection Time: 02/26/19  7:19 PM   Specimen: Nasopharyngeal Swab  Result Value Ref Range Status   SARS Coronavirus 2  NEGATIVE NEGATIVE Final    Comment: (NOTE) SARS-CoV-2 target nucleic acids are NOT DETECTED. The SARS-CoV-2 RNA is generally detectable in upper and lower respiratory specimens during the acute phase of infection. Negative results do not preclude SARS-CoV-2 infection, do not rule out co-infections with other pathogens, and should not be used as the sole basis for treatment or other patient management decisions. Negative results must be combined with clinical observations, patient history, and epidemiological information. The expected result is Negative. Fact Sheet for Patients: SugarRoll.be Fact Sheet for Healthcare Providers: https://www.woods-mathews.com/ This test is not yet approved or cleared by the [place] FDA and  has been authorized for detection and/or diagnosis of SARS-CoV-2 by FDA under an Emergency Use Authorization (EUA). This EUA will remain  in effect (meaning this test can be used) for the duration of the COVID-19 declaration under Section 56 4(b)(1) of the Act, 21 U.S.C. section 360bbb-3(b)(1), unless the authorization is terminated or revoked sooner. Performed at [facility], [place] [address], [place], [place] [address]   SARS CORONAVIRUS 2 (TAT 6-24 HRS) Nasopharyngeal Nasopharyngeal Swab     Status: None   Collection Time: 02/27/19 12:22 PM   Specimen: Nasopharyngeal Swab  Result Value Ref Range Status   SARS Coronavirus 2 NEGATIVE NEGATIVE Final  Comment: (NOTE) SARS-CoV-2 target nucleic acids are NOT DETECTED. The SARS-CoV-2 RNA is generally detectable in upper and lower respiratory specimens during the acute phase of infection. Negative results do not preclude SARS-CoV-2 infection, do not rule out co-infections with other pathogens, and should not be used as the sole basis for treatment or other patient management decisions. Negative results must be combined with clinical observations, patient history,  and epidemiological information. The expected result is Negative. Fact Sheet for Patients: SugarRoll.be Fact Sheet for Healthcare Providers: https://www.woods-mathews.com/ This test is not yet approved or cleared by the [place] FDA and  has been authorized for detection and/or diagnosis of SARS-CoV-2 by FDA under an Emergency Use Authorization (EUA). This EUA will remain  in effect (meaning this test can be used) for the duration of the COVID-19 declaration under Section 56 4(b)(1) of the Act, 21 U.S.C. section 360bbb-3(b)(1), unless the authorization is terminated or revoked sooner. Performed at [facility], [place] [address], [place], [place] [address]    Time coordinating discharge: 25 minutes  SIGNED:  Kerney Elbe, DO [facility] 02/28/2019, 11:45 AM Pager is on [place]  If 7PM-7AM, please contact night-coverage [email or web address] Password [email or web address]

## 2019-02-28 NOTE — TOC Transition Note (Signed)
Transition of Care [facility]) - CM/SW Discharge Note   Patient Details  Name: Vanessa Keller MRN: [id number] Date of Birth: June 13, 1915  Transition of Care [facility]) CM/SW Contact:  Lia Hopping, [place] Phone Number: 02/28/2019, 10:22 AM   Clinical Narrative:    Patient will return to [facility] today.  [facility] transport arranged to pick up the patient at 2:30pm, Nurse call report to: [phone] Patient HCPOA notified.    Final next level of care: Long Term Nursing Home Barriers to Discharge: Other (comment)(Isolation bed not readily available 11/18, bed will be ready 11/19. COVID-19 test pending.)   Patient Goals and CMS Choice        Discharge Placement                Patient to be transferred to facility by: [facility] Name of family member notified: Coralee Rud [phone] Patient and family notified of of transfer: 02/28/19  Discharge Plan and Services In-house Referral: Clinical Social Work, Hospice / Palliative Care   Post Acute Care Choice: Hospice                               Social Determinants of Health (SDOH) Interventions     Readmission Risk Interventions No flowsheet data found.

## 2019-02-28 NOTE — Progress Notes (Signed)
Attempted to call report to [facility], left message with supervisor and didn't get call back.

## 2019-03-03 LAB — CULTURE, BLOOD (ROUTINE X 2)
Culture: NO GROWTH
Culture: NO GROWTH

## 2019-04-12 DEATH — deceased
